# Patient Record
Sex: Female | Born: 1945 | Race: White | Hispanic: No | Smoking: Former smoker
Health system: Southern US, Community
[De-identification: ages and names within clinical notes are randomized; demographics above are authoritative.]

## PROBLEM LIST (undated history)

## (undated) DIAGNOSIS — C801 Malignant (primary) neoplasm, unspecified: Secondary | ICD-10-CM

## (undated) DIAGNOSIS — G4730 Sleep apnea, unspecified: Secondary | ICD-10-CM

## (undated) DIAGNOSIS — E119 Type 2 diabetes mellitus without complications: Secondary | ICD-10-CM

## (undated) DIAGNOSIS — D1802 Hemangioma of intracranial structures: Secondary | ICD-10-CM

## (undated) DIAGNOSIS — J45909 Unspecified asthma, uncomplicated: Secondary | ICD-10-CM

## (undated) DIAGNOSIS — E785 Hyperlipidemia, unspecified: Secondary | ICD-10-CM

## (undated) DIAGNOSIS — M1990 Unspecified osteoarthritis, unspecified site: Secondary | ICD-10-CM

## (undated) DIAGNOSIS — B351 Tinea unguium: Secondary | ICD-10-CM

## (undated) DIAGNOSIS — M810 Age-related osteoporosis without current pathological fracture: Secondary | ICD-10-CM

## (undated) DIAGNOSIS — E039 Hypothyroidism, unspecified: Secondary | ICD-10-CM

## (undated) HISTORY — PX: OTHER SURGICAL HISTORY: SHX169

## (undated) HISTORY — PX: OOPHORECTOMY: SHX86

## (undated) HISTORY — PX: TOTAL THYROIDECTOMY: SHX2547

## (undated) HISTORY — PX: TONSILLECTOMY: SUR1361

---

## 2010-12-30 DIAGNOSIS — H9193 Unspecified hearing loss, bilateral: Secondary | ICD-10-CM | POA: Insufficient documentation

## 2012-04-08 DIAGNOSIS — E669 Obesity, unspecified: Secondary | ICD-10-CM | POA: Insufficient documentation

## 2012-04-08 DIAGNOSIS — G4762 Sleep related leg cramps: Secondary | ICD-10-CM | POA: Insufficient documentation

## 2013-06-16 IMAGING — CR DG KNEE COMPLETE 4+V*R*
1 series · 4 of 4 positions shown · non-contrast
Comparison: None.

CLINICAL DATA: Fall.  Acute RIGHT knee pain.  Initial encounter.

EXAM:
RIGHT KNEE - COMPLETE 4+ VIEW

[Series 1: ap · 0.17mm/px · 4 of 4 slices shown]
[im 1/4]
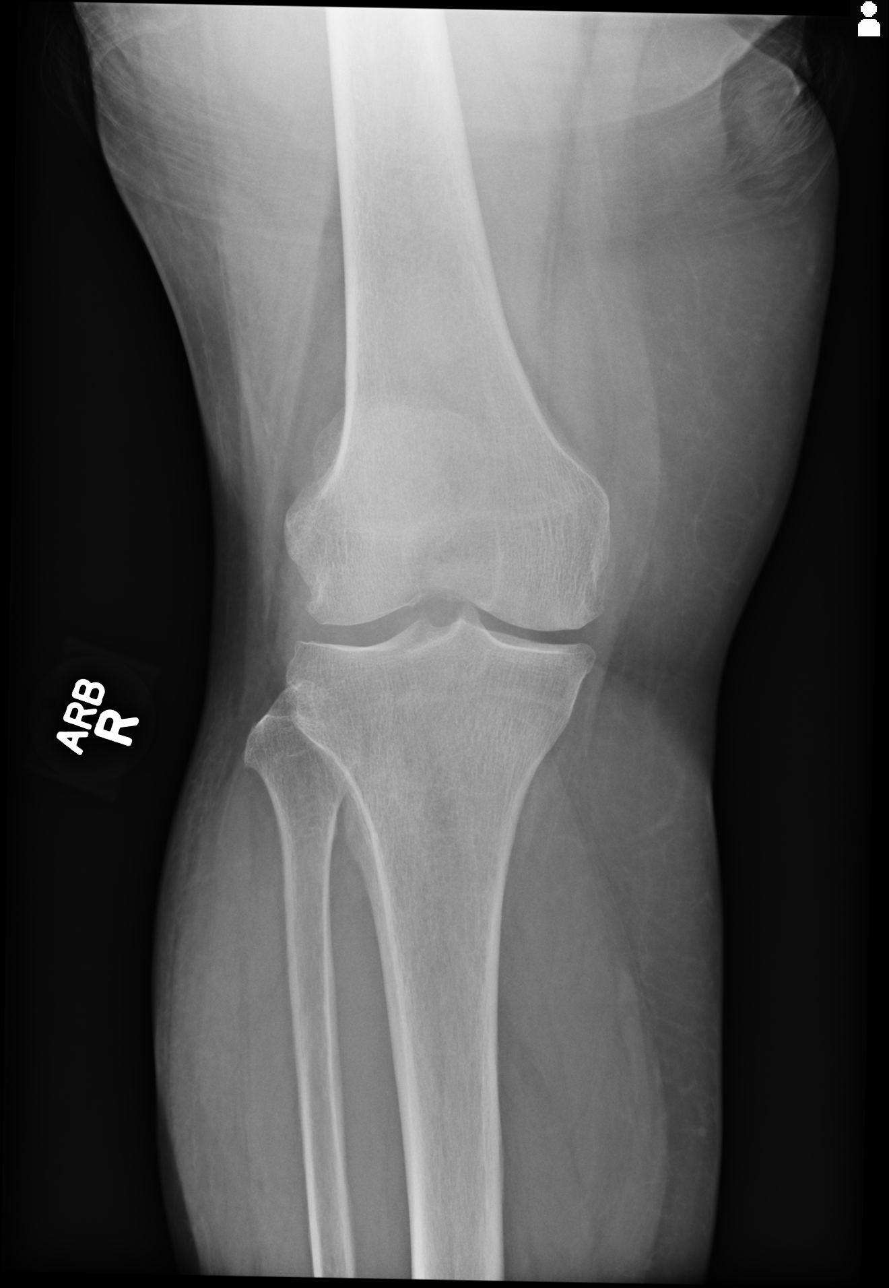
[im 2/4]
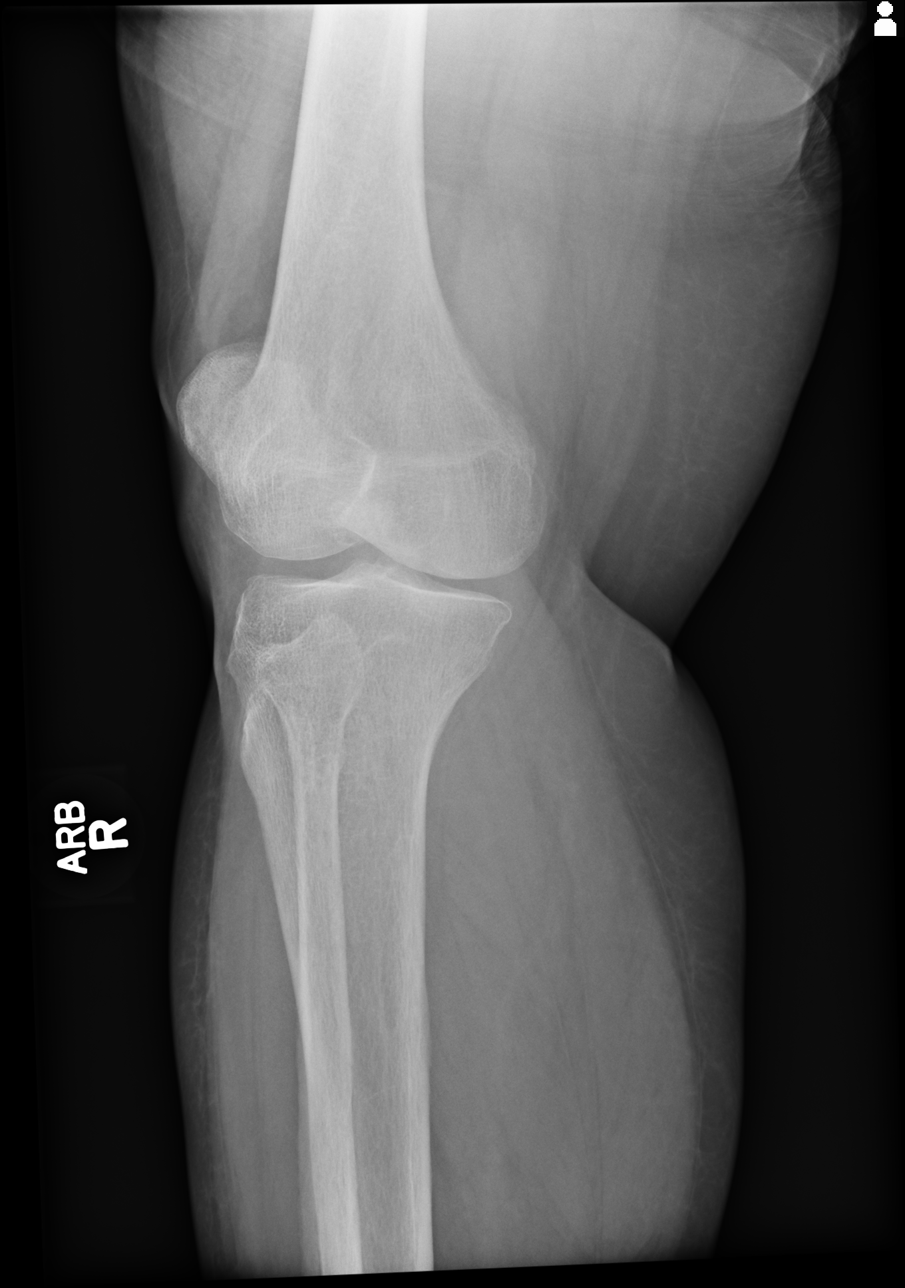
[im 3/4]
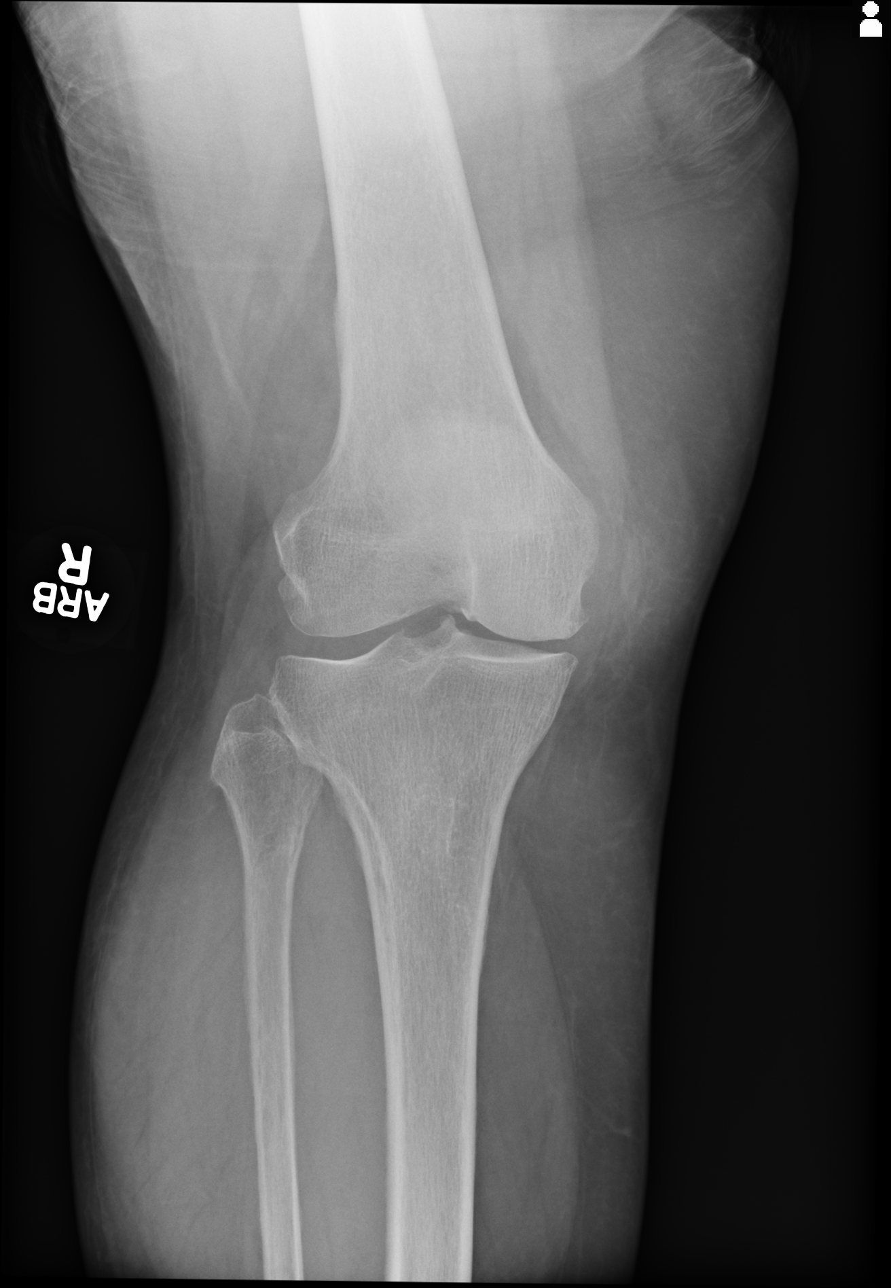
[im 4/4]
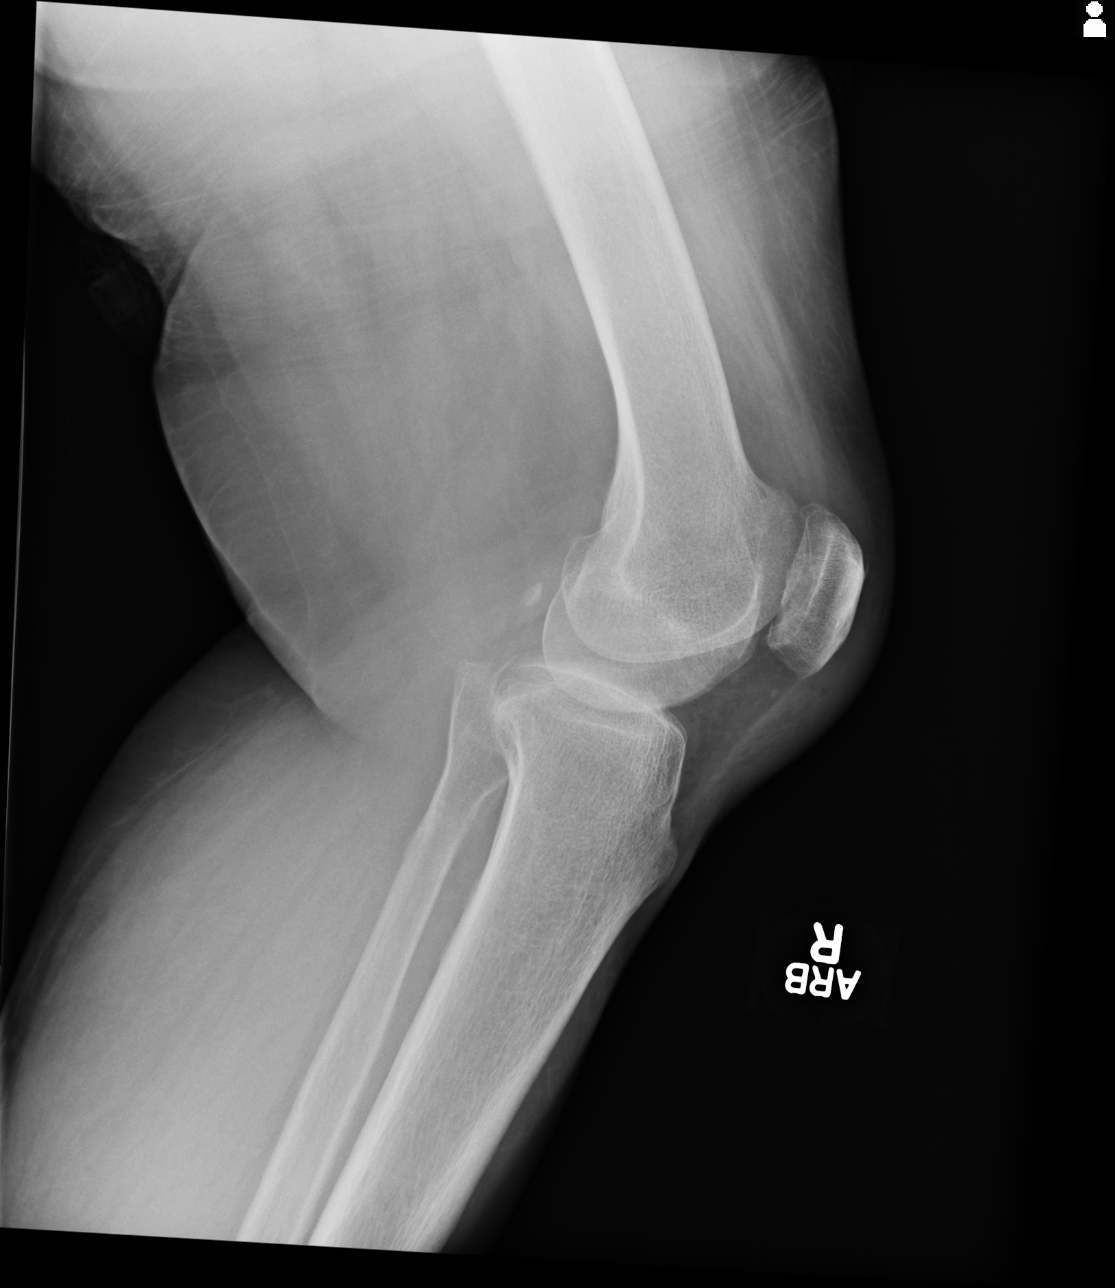

[4 of 4 positions shown; findings below may reference images not displayed]

FINDINGS: Mild medial compartment osteoarthritis. No fracture. Anatomic
alignment. No destructive osseous lesions. Negative for effusion.
Moderate patellofemoral osteoarthritis.
IMPRESSION: No acute osseous injury.

## 2013-06-16 IMAGING — CR DG FOOT COMPLETE 3+V*L*
1 series · 3 of 3 positions shown · non-contrast
Comparison: None.

CLINICAL DATA: Fall.  Foot pain.

EXAM:
LEFT FOOT - COMPLETE 3+ VIEW

[Series 1: ap · 0.17mm/px · 3 of 3 slices shown]
[im 1/3]
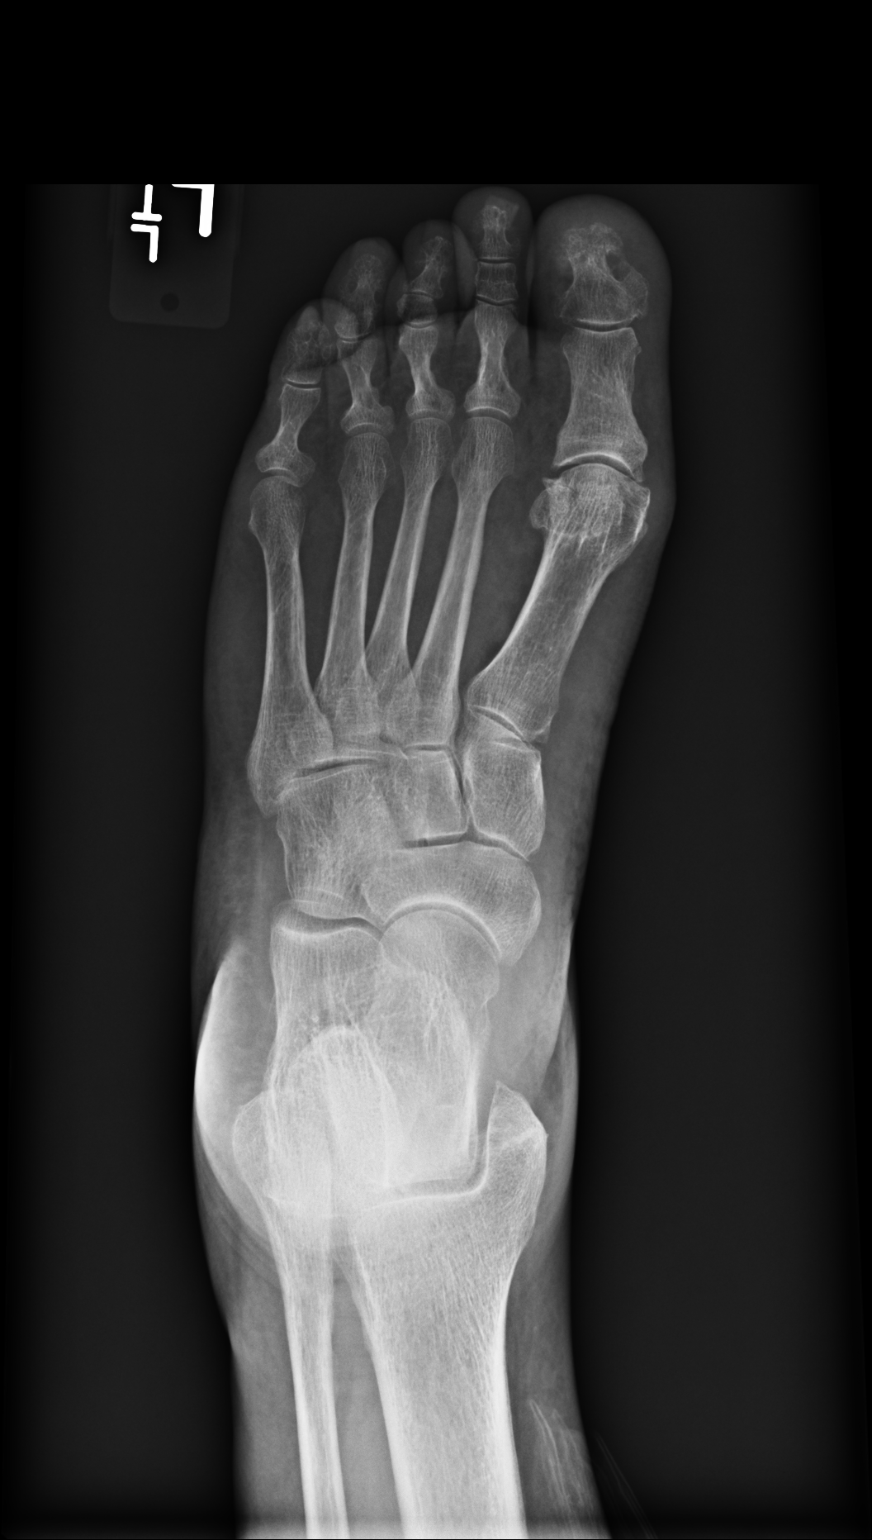
[im 2/3]
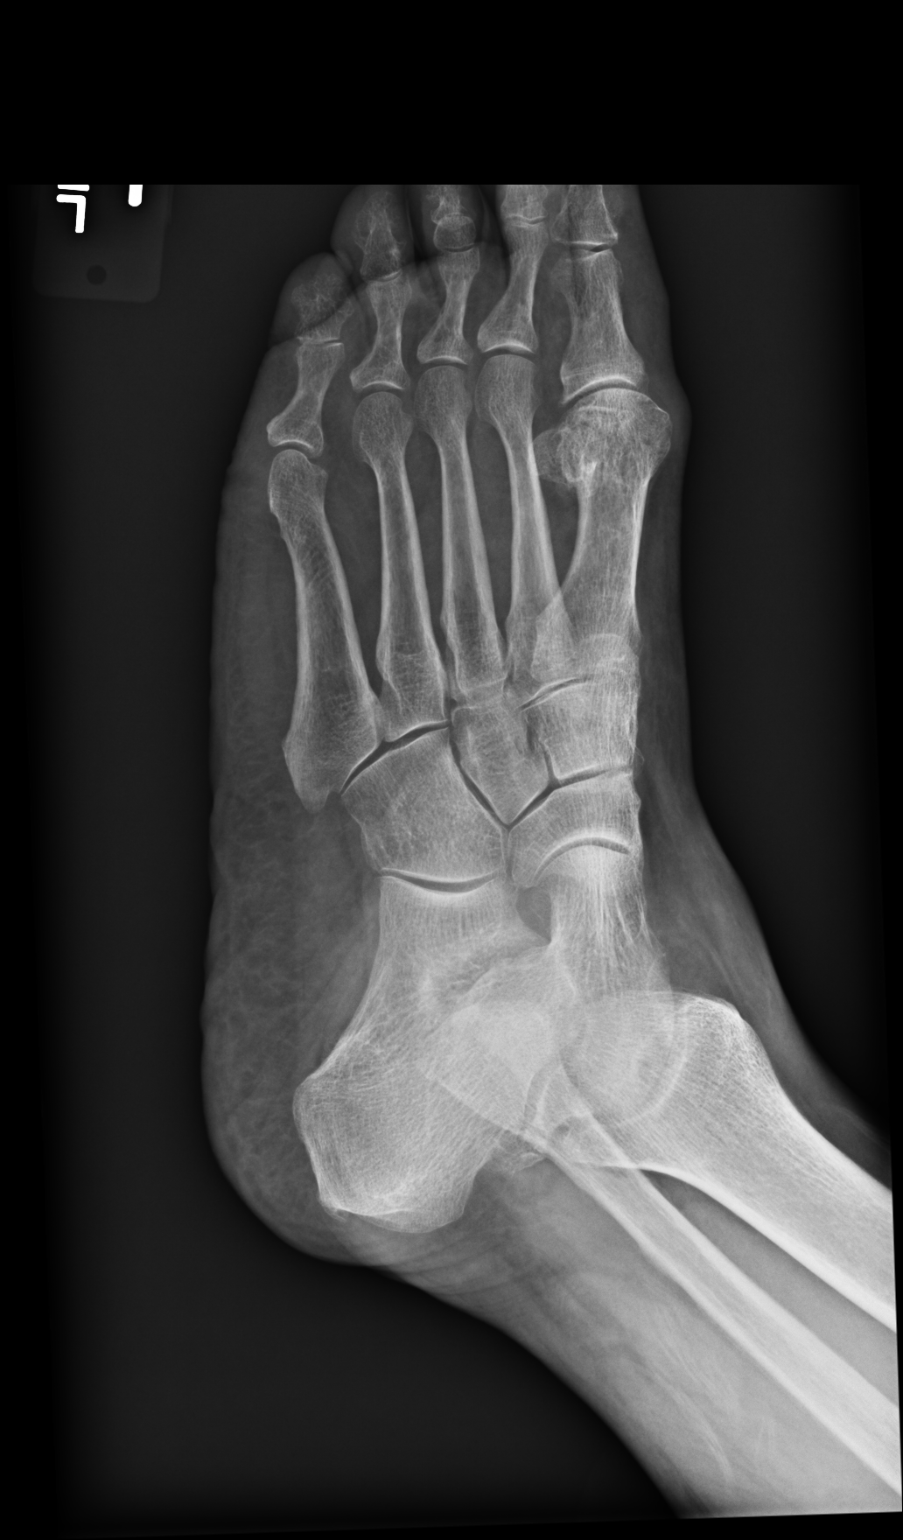
[im 3/3]
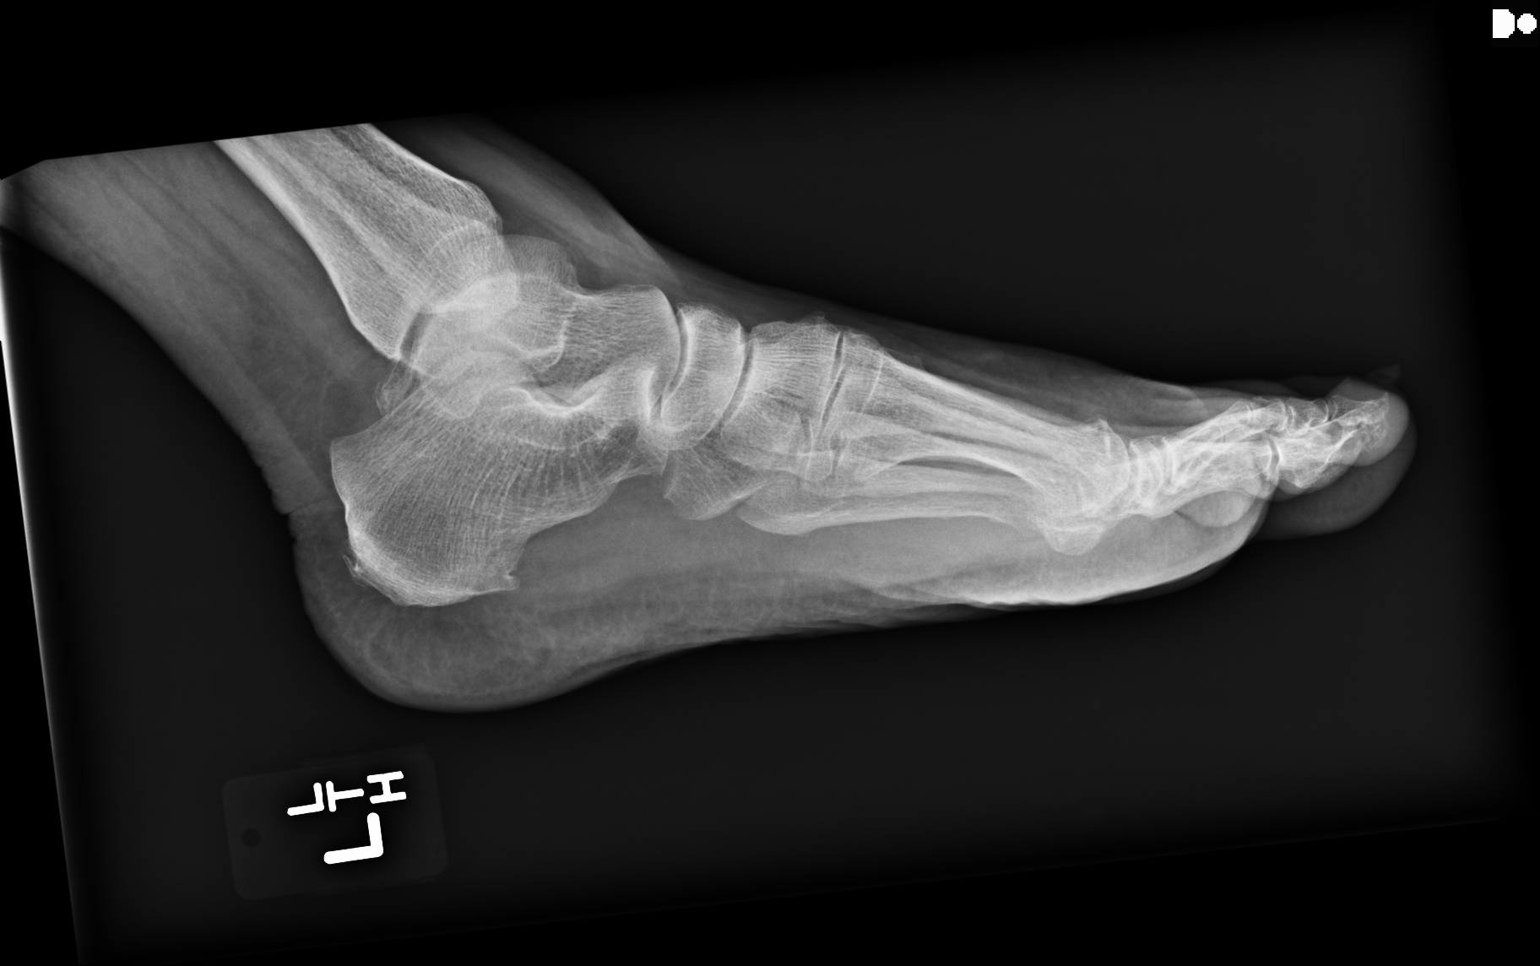

[3 of 3 positions shown; findings below may reference images not displayed]

FINDINGS: Moderate first MTP joint osteoarthritis. No acute displaced fracture
is identified. Soft tissues appear within normal limits.
IMPRESSION: No acute osseous abnormality.

## 2013-07-28 DIAGNOSIS — C73 Malignant neoplasm of thyroid gland: Secondary | ICD-10-CM | POA: Insufficient documentation

## 2013-10-01 ENCOUNTER — Emergency Department: Payer: Self-pay | Admitting: Emergency Medicine

## 2013-10-01 ENCOUNTER — Ambulatory Visit: Payer: Self-pay | Admitting: Internal Medicine

## 2013-10-01 IMAGING — CT CT MAXILLOFACIAL WITHOUT CONTRAST
4 series · 16 of 47 positions shown, 18 images · non-contrast
Comparison: None.

CLINICAL DATA: Fall. LEFT-sided head and facial injuries. Injury to
the LEFT globe.

EXAM:
CT HEAD WITHOUT CONTRAST
CT MAXILLOFACIAL WITHOUT CONTRAST
TECHNIQUE: Multidetector CT imaging of the head and maxillofacial structures
were performed using the standard protocol without intravenous
contrast. Multiplanar CT image reconstructions of the maxillofacial
structures were also generated.

[Series 2: head wo · axial · 0.48mm/px · z∈[-113,-15]mm · 6 of 30 slices shown, 8 images]
[im 5/30  brain]
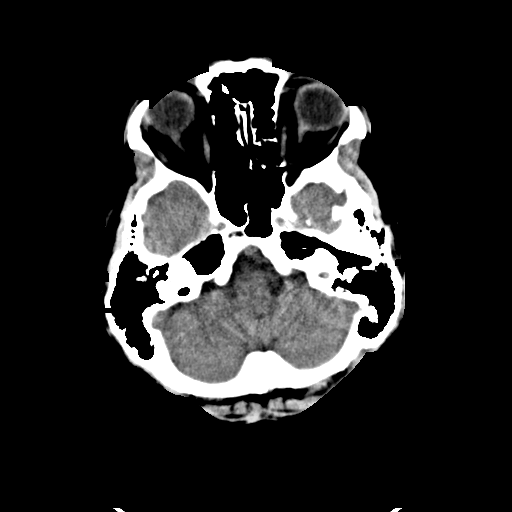
[im 5/30  bone]
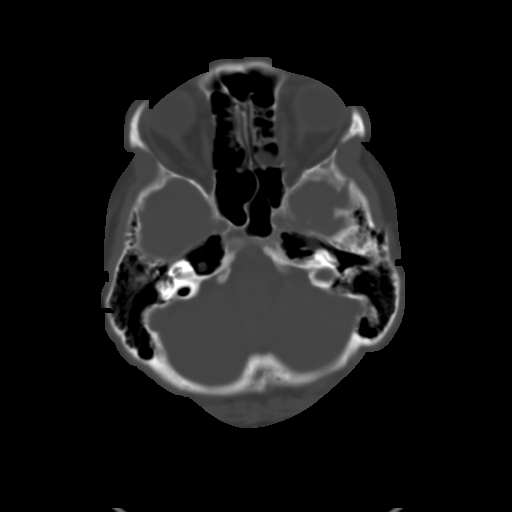
[im 9/30  bone]
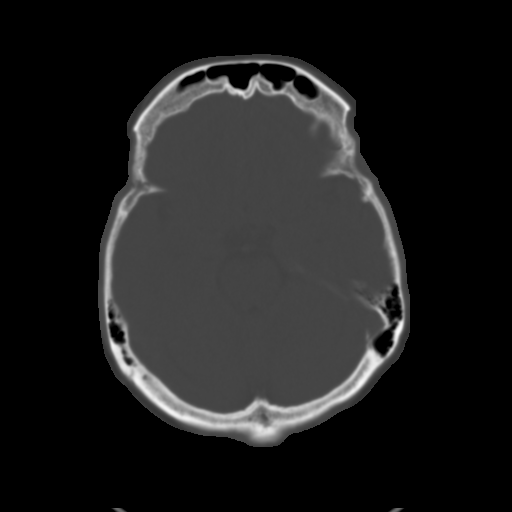
[im 13/30  bone]
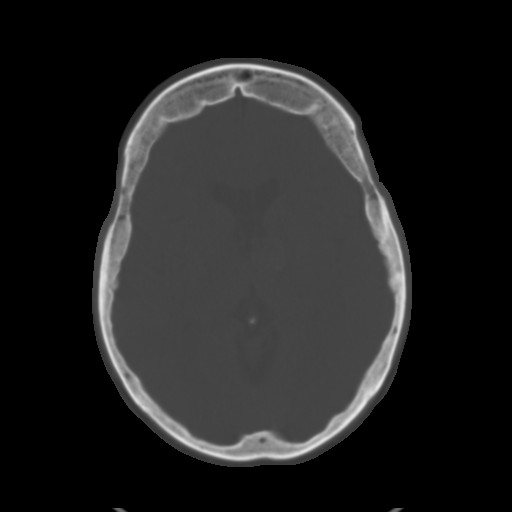
[im 17/30  bone]
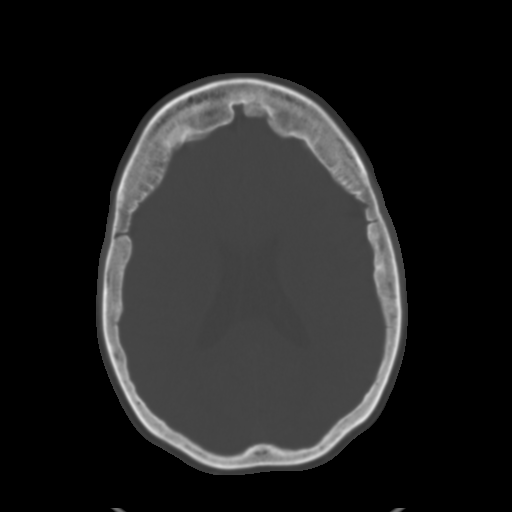
[im 21/30  brain]
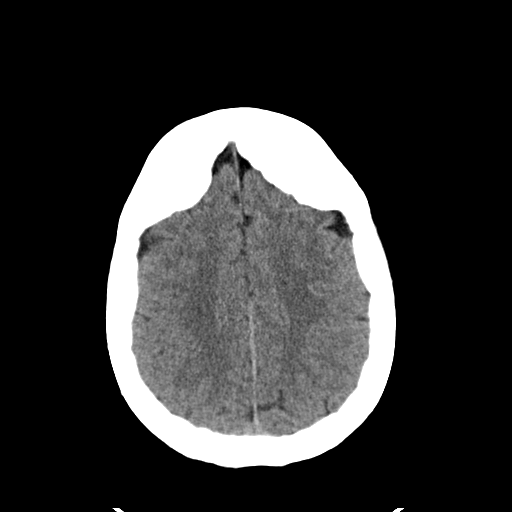
[im 21/30  bone]
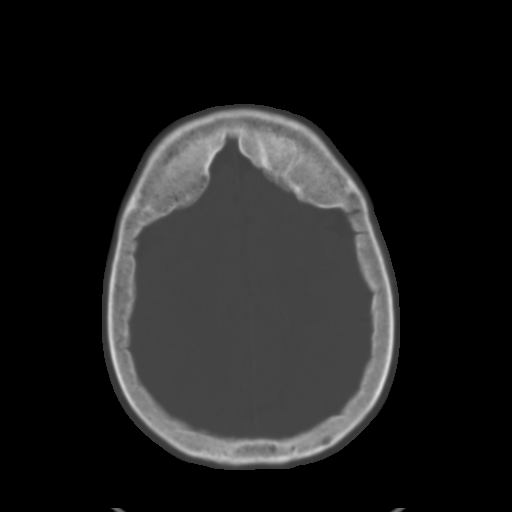
[im 25/30  bone]
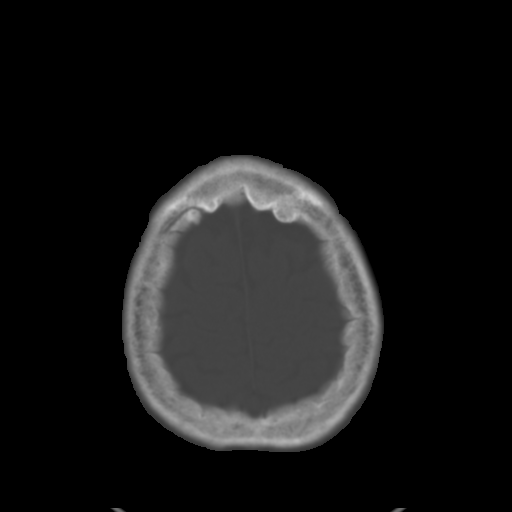

[Series 3: max soft · axial · 0.31mm/px · z∈[-239,-181]mm · 4 of 88 slices shown]
[im 9/88  brain]
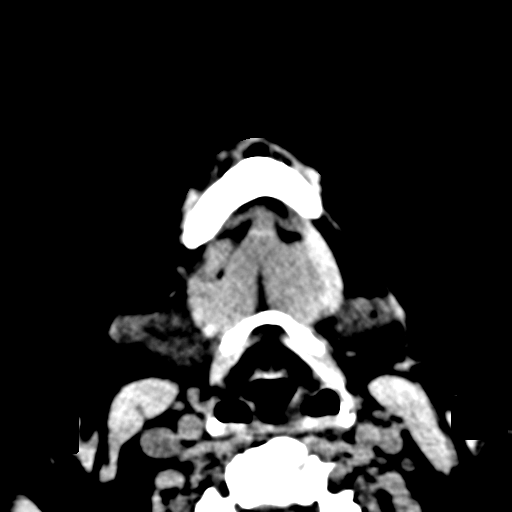
[im 17/88  brain]
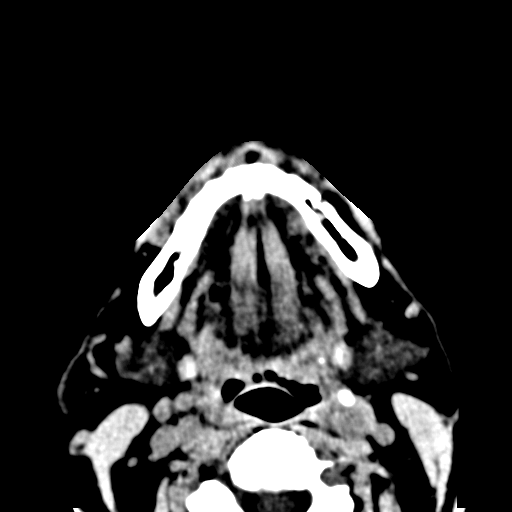
[im 30/88  brain]
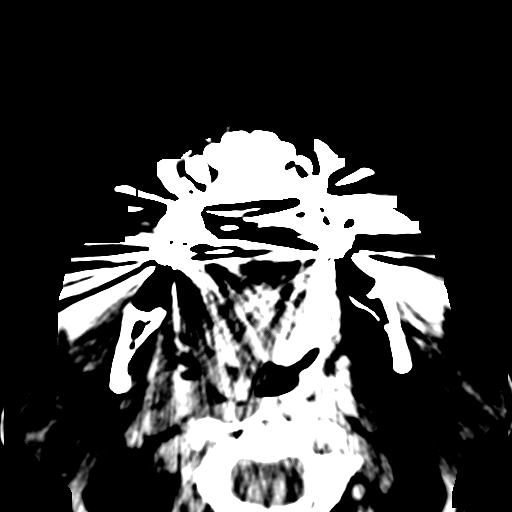
[im 38/88  brain]
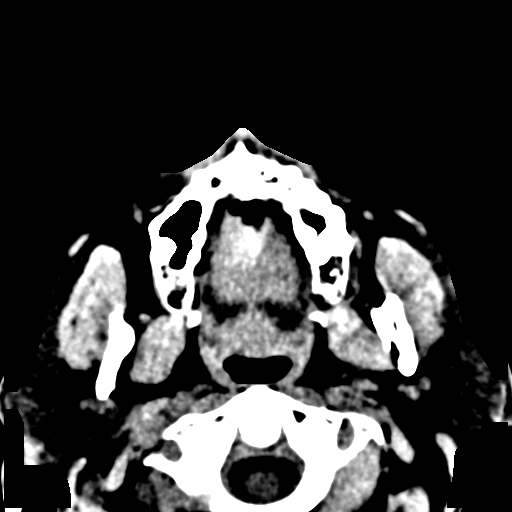

[Series 6: coronal soft · coronal · 0.35mm/px · 3 of 81 slices shown]
[im 27/81  bone]
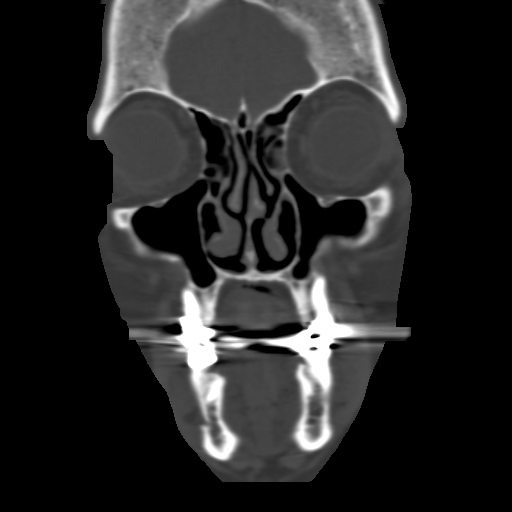
[im 36/81  bone]
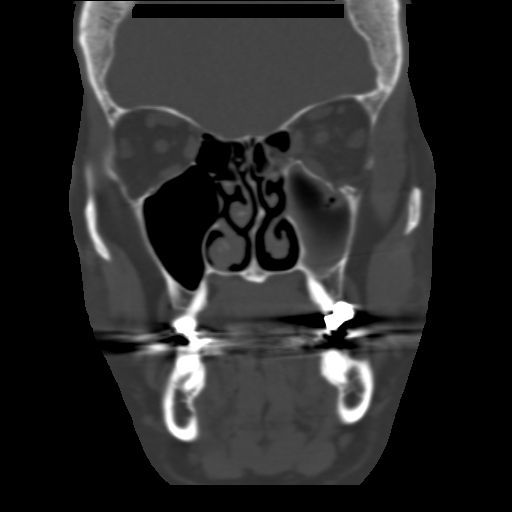
[im 45/81  bone]
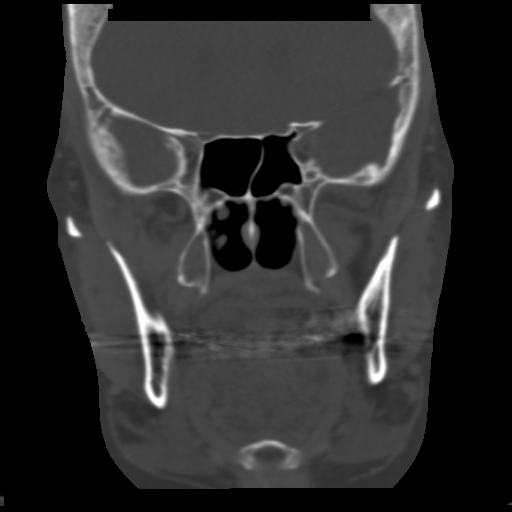

[Series 7: sagittal soft · sagittal · 0.36mm/px · 3 of 81 slices shown]
[im 27/81  bone]
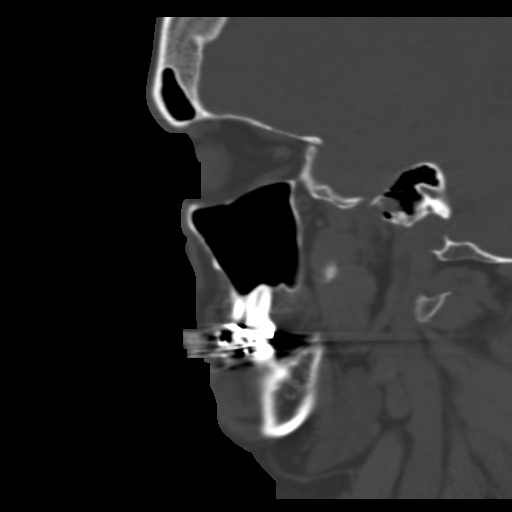
[im 41/81  bone]
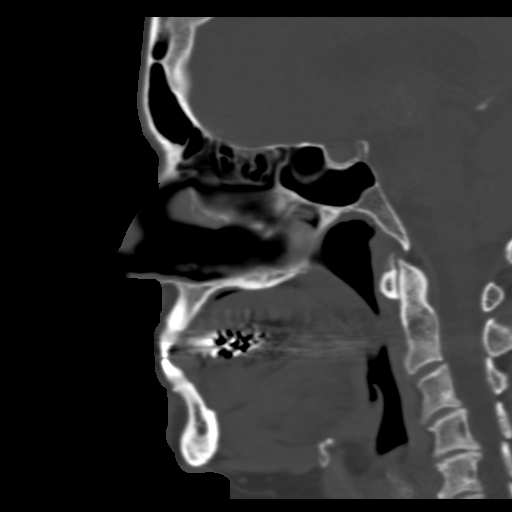
[im 54/81  bone]
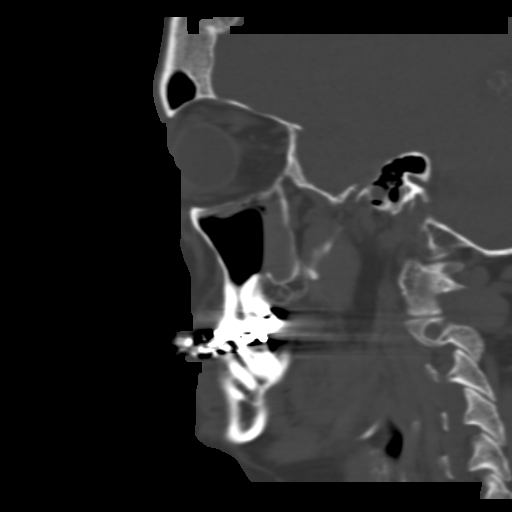

[16 of 47 positions shown; findings below may reference images not displayed]

FINDINGS: CT HEAD FINDINGS

There is a partially calcified mass in the LEFT basal ganglia,
extending into the anterior LEFT thalamus. This measures 28 mm x 20
mm. Cavum septum pellucidum is present. There is no midline shift or
hydrocephalus. The calvarium is intact. No skull fracture or
intracranial hemorrhage.

CT MAXILLOFACIAL FINDINGS

Globes: Intact

Bony orbits: LEFT orbital blowout fracture is present. Mild
depression the LEFT orbital floor. There is also LEFT medial orbital
wall blowout fracture with opacification of the LEFT ethmoid air
cells. Fractures nondisplaced. LEFT maxillary hemo sinus is present.
No herniation of extra-ocular muscles through the orbital floor
fracture. The lateral orbital rim appears intact. There is also a
fracture of the superior orbital rim, with a mildly displaced
fragment into the intraorbital fat superior to the superior rectus
muscle (image 59 series 9).

Pterygoid plates: Intact

Mandibular condyles:  Located.

Mandible: Intact.

Teeth:  Artifact from dental hardware.

Intracranial contents:  Grossly normal

Paranasal sinuses: LEFT maxillary hemo sinus and LEFT ethmoid air
cell hemo sinus. Otherwise clear.

Mastoid air cells: Clear.

Nasal bones: Intact.

Soft tissues: LEFT periorbital contusion. No intraconal or
intraorbital hematoma.

Visible cervical spine: Spondylosis.
IMPRESSION: 1. No acute intracranial abnormality. Partially calcified 28 mm x 20
mm LEFT basal ganglia mass consistent with neoplasm. The appearance
is suspicious for glial tumor such as oligodendroglioma. Lymphoma or
meningioma considered less likely however MRI should be helpful to
differentiate. Followup MRI is recommended with and without contrast
for further assessment.
2. LEFT orbital floor and medial orbital wall blowout fractures. No
CT evidence of entrapment. Mildly displaced superior orbital rim
fracture.

## 2013-11-06 ENCOUNTER — Ambulatory Visit: Payer: Self-pay | Admitting: Ophthalmology

## 2013-11-06 IMAGING — CT CT ORBITS WITHOUT CONTRAST
3 series · 15 of 47 positions shown, 18 images · non-contrast
Comparison: CT head and maxillofacial [DATE].

CLINICAL DATA: Left orbital fracture. The patient fell into a door
approximately 1 month ago. Diplopia when looking to the left.
Subsequent encounter.

EXAM:
CT ORBITS WITHOUT CONTRAST
TECHNIQUE: Multidetector CT imaging of the orbits was performed following the
standard protocol without intravenous contrast.

[Series 3: orbits soft---- · axial · 0.31mm/px · z∈[+150,+256]mm · 9 of 63 slices shown, 12 images]
[im 5/63  brain]
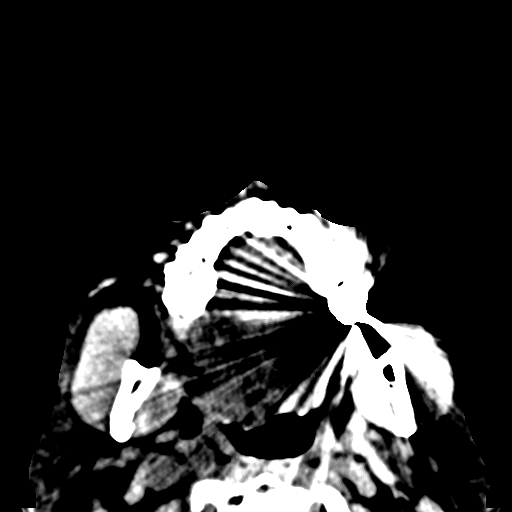
[im 5/63  bone]
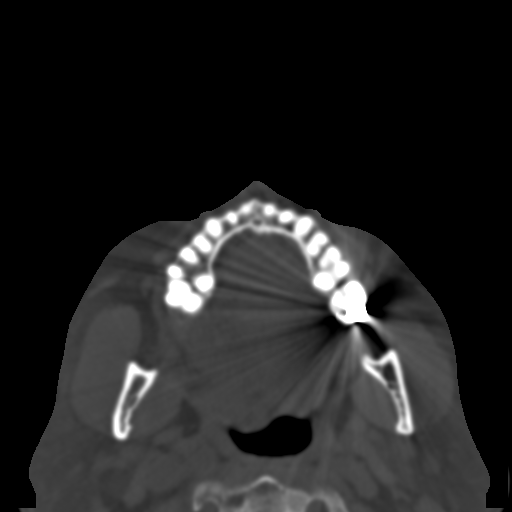
[im 11/63  bone]
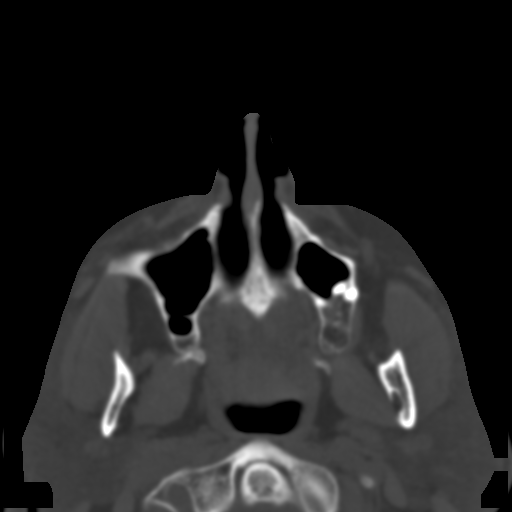
[im 18/63  bone]
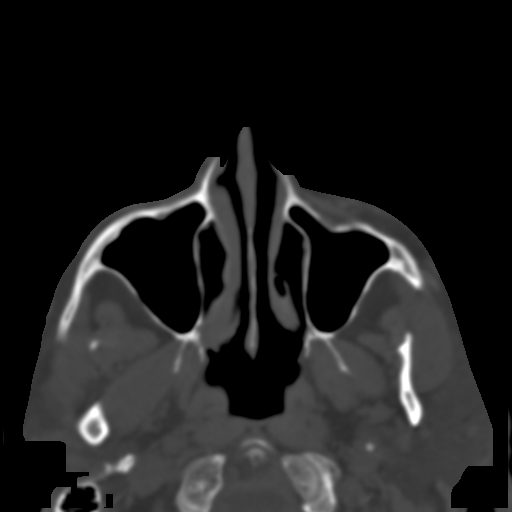
[im 24/63  bone]
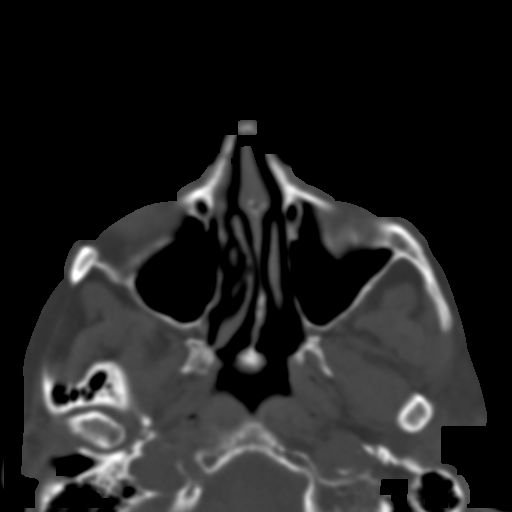
[im 33/63  brain]
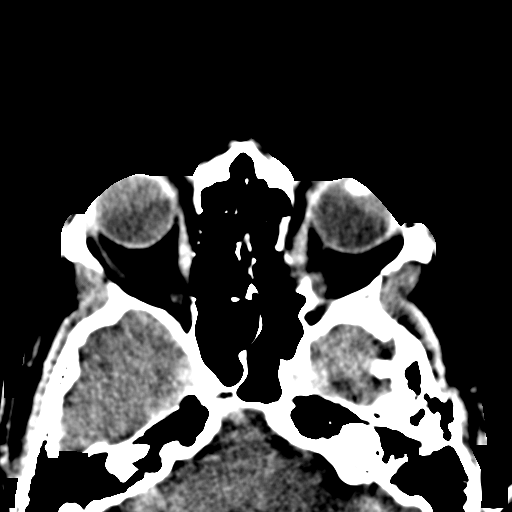
[im 33/63  bone]
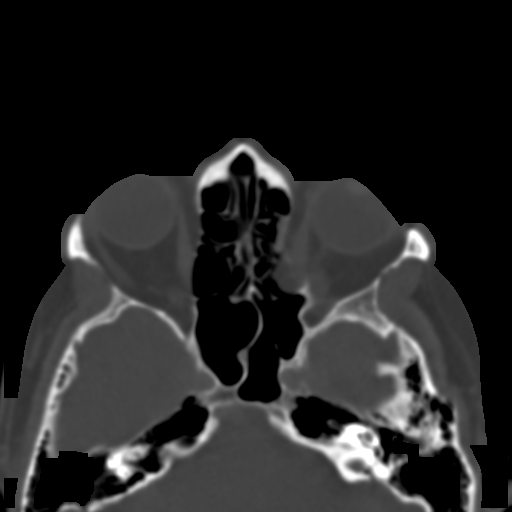
[im 39/63  bone]
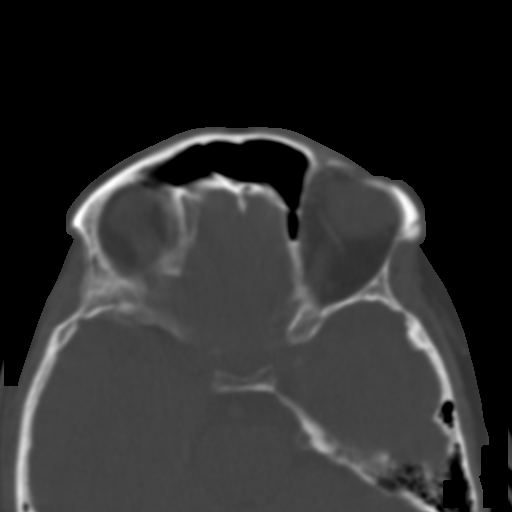
[im 45/63  bone]
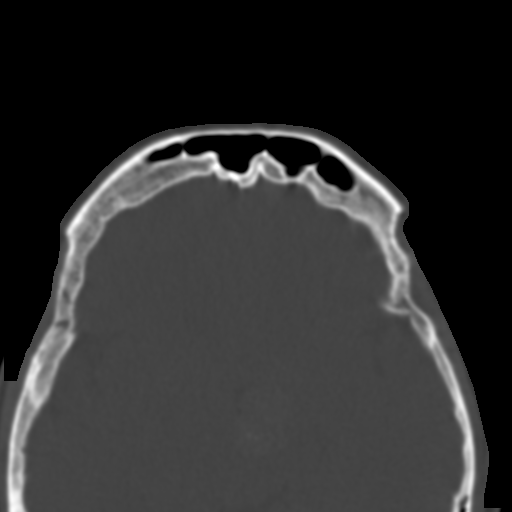
[im 52/63  bone]
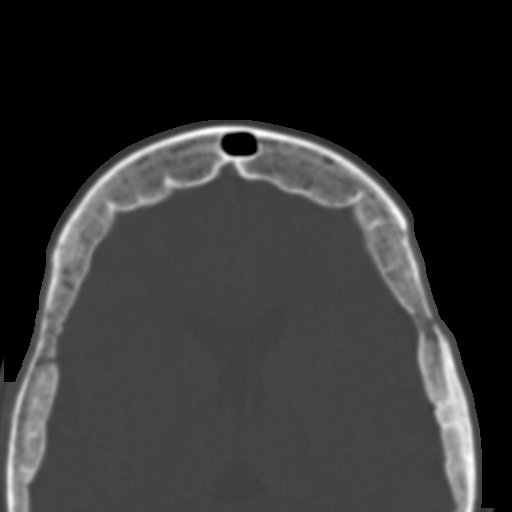
[im 58/63  brain]
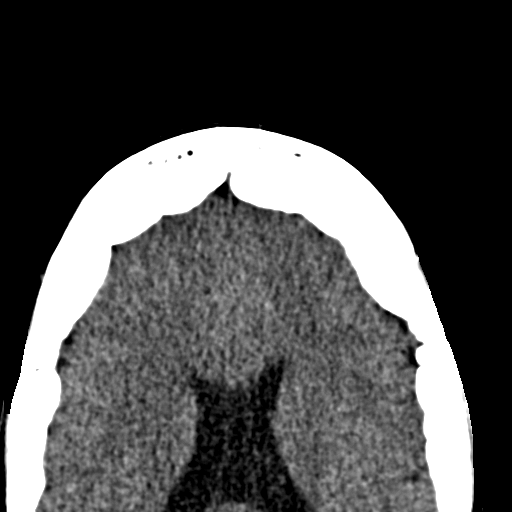
[im 58/63  bone]
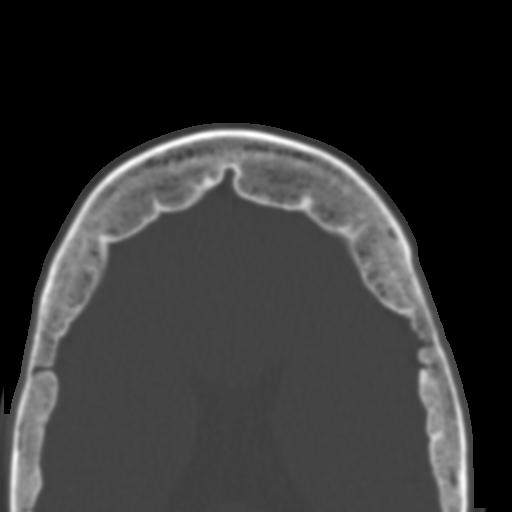

[Series 5: coronal soft · coronal · 0.26mm/px · 3 of 58 slices shown]
[im 20/58  bone]
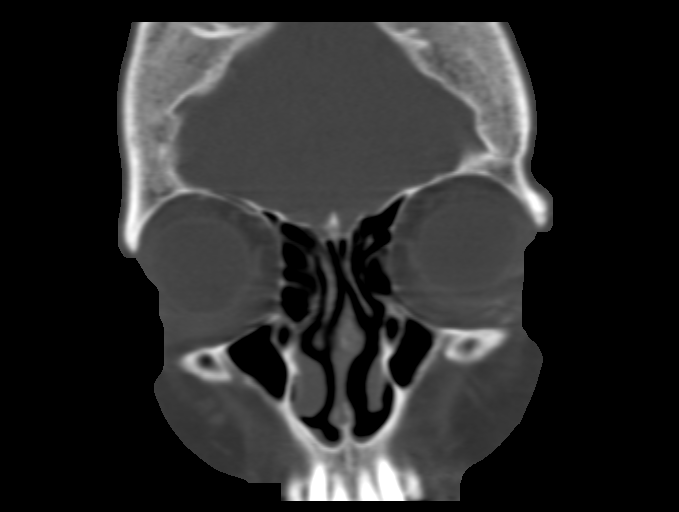
[im 26/58  bone]
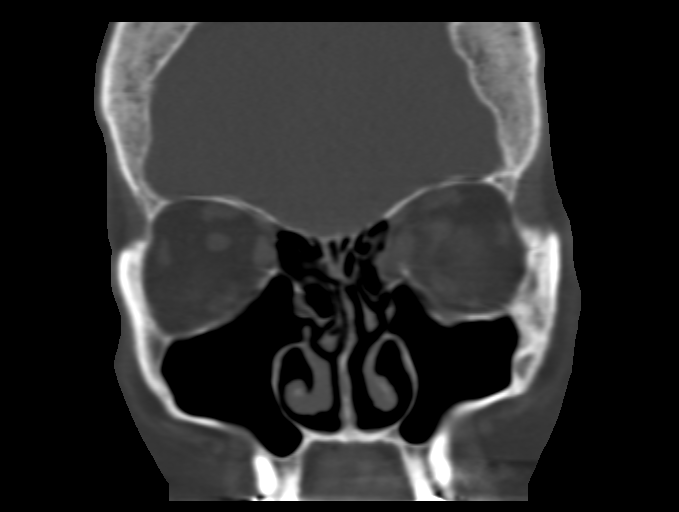
[im 32/58  bone]
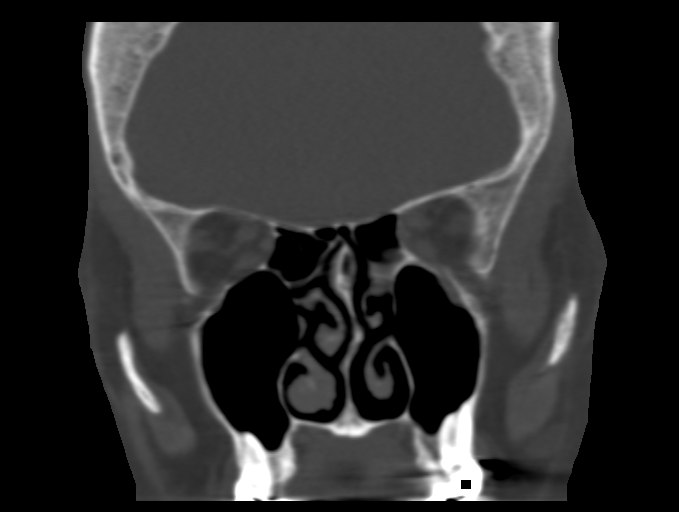

[Series 6: sagittal soft · sagittal · 0.27mm/px · 3 of 83 slices shown]
[im 28/83  bone]
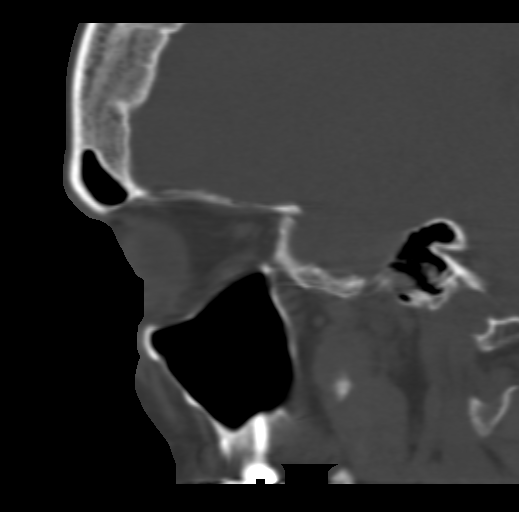
[im 42/83  bone]
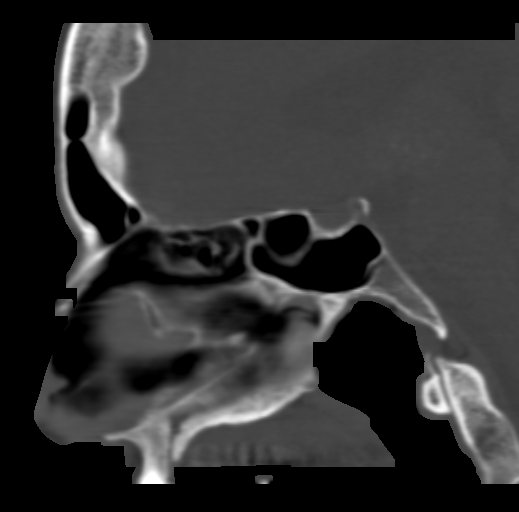
[im 55/83  bone]
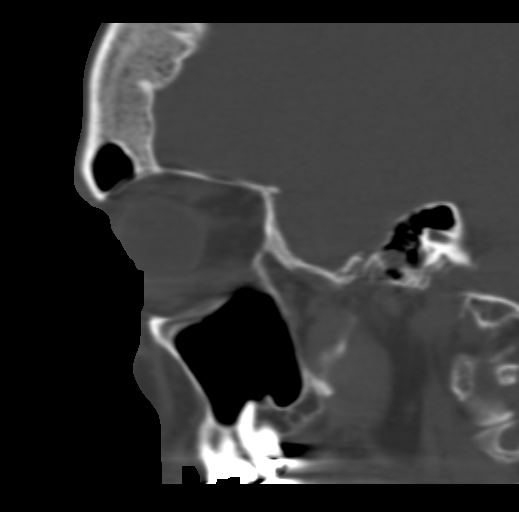

[15 of 47 positions shown; findings below may reference images not displayed]

FINDINGS: A calcified left basal ganglia and hypothalamic mass is stable to
slightly decreased in size compared to the prior exam. It measures
2.5 x 2.2 x 1.7 cm on today's exam. There is some local mass effect
on the ventricle without significant surrounding edema.

The left orbital floor fracture and medial ethmoid blowout fracture
are again noted. There is fat herniating into the left ethmoid air
cells posteriorly. The left medial rectus muscle is immediately
adjacent to this fracture and may be fixed by scar tissue and
adhesions posteriorly. The inferior rectus muscle is separate from
the orbital floor fracture.

The left maxillary fluid has resolved. No new fractures are present.
The globes are intact.
IMPRESSION: 1. Medial deviation of the posterior aspect of the left medial
rectus muscle may represent adhesion associated with scar tissue in
the medial orbital blowout fracture.
2. Minimally displaced left orbital floor fracture without
involvement of the left inferior rectus muscle.
3. Interval resolution of fluid in the left maxillary sinus.
4. No new fractures.
5. 2.5 cm calcified mass lesion within the left basal ganglia and
hypothalamus is stable to slightly decreased in size since the prior
exam. This remains concerning neoplasm, specifically
oligodendroglioma or lymphoma.

## 2013-11-30 DIAGNOSIS — E890 Postprocedural hypothyroidism: Secondary | ICD-10-CM | POA: Insufficient documentation

## 2014-06-25 ENCOUNTER — Encounter
Admission: RE | Disposition: A | Discharge status: Home / Self Care | Payer: Self-pay | Source: Ambulatory Visit | Attending: Unknown Physician Specialty

## 2014-06-25 ENCOUNTER — Encounter: Payer: Self-pay | Admitting: Anesthesiology

## 2014-06-25 ENCOUNTER — Ambulatory Visit
Admission: RE | Admit: 2014-06-25 | Discharge: 2014-06-25 | Disposition: A | Discharge status: Home / Self Care | Payer: Medicare Other | Source: Ambulatory Visit | Attending: Unknown Physician Specialty | Admitting: Unknown Physician Specialty

## 2014-06-25 ENCOUNTER — Ambulatory Visit: Payer: Medicare Other | Admitting: Anesthesiology

## 2014-06-25 DIAGNOSIS — Z791 Long term (current) use of non-steroidal anti-inflammatories (NSAID): Secondary | ICD-10-CM | POA: Diagnosis not present

## 2014-06-25 DIAGNOSIS — F419 Anxiety disorder, unspecified: Secondary | ICD-10-CM | POA: Insufficient documentation

## 2014-06-25 DIAGNOSIS — N189 Chronic kidney disease, unspecified: Secondary | ICD-10-CM | POA: Diagnosis not present

## 2014-06-25 DIAGNOSIS — E669 Obesity, unspecified: Secondary | ICD-10-CM | POA: Diagnosis not present

## 2014-06-25 DIAGNOSIS — J45909 Unspecified asthma, uncomplicated: Secondary | ICD-10-CM | POA: Diagnosis not present

## 2014-06-25 DIAGNOSIS — K6389 Other specified diseases of intestine: Secondary | ICD-10-CM | POA: Diagnosis not present

## 2014-06-25 DIAGNOSIS — Z6838 Body mass index (BMI) 38.0-38.9, adult: Secondary | ICD-10-CM | POA: Insufficient documentation

## 2014-06-25 DIAGNOSIS — K2970 Gastritis, unspecified, without bleeding: Secondary | ICD-10-CM | POA: Diagnosis not present

## 2014-06-25 DIAGNOSIS — Z85850 Personal history of malignant neoplasm of thyroid: Secondary | ICD-10-CM | POA: Insufficient documentation

## 2014-06-25 DIAGNOSIS — E063 Autoimmune thyroiditis: Secondary | ICD-10-CM | POA: Diagnosis not present

## 2014-06-25 DIAGNOSIS — Z79899 Other long term (current) drug therapy: Secondary | ICD-10-CM | POA: Diagnosis not present

## 2014-06-25 DIAGNOSIS — K222 Esophageal obstruction: Secondary | ICD-10-CM | POA: Diagnosis not present

## 2014-06-25 DIAGNOSIS — K269 Duodenal ulcer, unspecified as acute or chronic, without hemorrhage or perforation: Secondary | ICD-10-CM | POA: Insufficient documentation

## 2014-06-25 DIAGNOSIS — G4730 Sleep apnea, unspecified: Secondary | ICD-10-CM | POA: Insufficient documentation

## 2014-06-25 DIAGNOSIS — Z79810 Long term (current) use of selective estrogen receptor modulators (SERMs): Secondary | ICD-10-CM | POA: Diagnosis not present

## 2014-06-25 DIAGNOSIS — I129 Hypertensive chronic kidney disease with stage 1 through stage 4 chronic kidney disease, or unspecified chronic kidney disease: Secondary | ICD-10-CM | POA: Diagnosis not present

## 2014-06-25 DIAGNOSIS — R1310 Dysphagia, unspecified: Secondary | ICD-10-CM | POA: Diagnosis present

## 2014-06-25 HISTORY — PX: ESOPHAGOGASTRODUODENOSCOPY: SHX5428

## 2014-06-25 HISTORY — PX: SAVORY DILATION: SHX5439

## 2014-06-25 SURGERY — EGD (ESOPHAGOGASTRODUODENOSCOPY)
Anesthesia: General

## 2014-06-25 MED ORDER — GLYCOPYRROLATE 0.2 MG/ML IJ SOLN
INTRAMUSCULAR | Status: DC | PRN
  Administered 2014-06-25: 0.1 mg via INTRAVENOUS

## 2014-06-25 MED ORDER — PROPOFOL INFUSION 10 MG/ML OPTIME
INTRAVENOUS | Status: DC | PRN
  Administered 2014-06-25: 100 ug/kg/min via INTRAVENOUS

## 2014-06-25 MED ORDER — SODIUM CHLORIDE 0.9 % IV SOLN
INTRAVENOUS | Status: DC
  Administered 2014-06-25: 14:00:00 via INTRAVENOUS

## 2014-06-25 MED ORDER — LIDOCAINE HCL (CARDIAC) 20 MG/ML IV SOLN
INTRAVENOUS | Status: DC | PRN
  Administered 2014-06-25: 60 mg via INTRAVENOUS

## 2014-06-25 MED ORDER — MIDAZOLAM HCL 2 MG/2ML IJ SOLN
INTRAMUSCULAR | Status: DC | PRN
  Administered 2014-06-25: 2 mg via INTRAVENOUS

## 2014-06-25 MED ORDER — PROPOFOL 10 MG/ML IV BOLUS
INTRAVENOUS | Status: DC | PRN
  Administered 2014-06-25: 40 mg via INTRAVENOUS

## 2014-06-25 NOTE — Op Note (Signed)
[facility] Patient Name: Sherry Paul Procedure Date: 06/25/2014 1:25 PM MRN: [id number] Account #: [id number] Date of Birth: 08-Jan-1946 Admit Type: Outpatient Age: 69 Room: [facility] ENDO ROOM 1 Gender: Female Note Status: Finalized Procedure:         Upper GI endoscopy Indications:       Dysphagia Providers:         Manya Silvas, MD Referring MD:      Rogue Jury, MD (Referring MD) Medicines:         Propofol per Anesthesia Complications:     No immediate complications. Procedure:         Pre-Anesthesia Assessment:                    - After reviewing the risks and benefits, the patient was                     deemed in satisfactory condition to undergo the procedure.                    After obtaining informed consent, the endoscope was passed                     under direct vision. Throughout the procedure, the                     patient's blood pressure, pulse, and oxygen saturations                     were monitored continuously. The Olympus GIF-160 endoscope                     (S#. [id number]) was introduced through the mouth, and                     advanced to the second part of duodenum. The upper GI                     endoscopy was accomplished without difficulty. The patient                     tolerated the procedure well. Findings:      A mild Schatzki ring (acquired) was found at the gastroesophageal       junction. A guidewire was placed and the scope was withdrawn. Dilation       was performed with a Savary dilator with mild resistance at 16 mm and 17       mm. GEJ 40cm.      Patchy mild inflammation characterized by erythema and granularity was       found in the gastric antrum. Biopsies were taken with a cold forceps for       histology. Biopsies were taken with a cold forceps for Helicobacter       pylori testing.      Multiple diffuse erosions without bleeding were found in the duodenal       bulb.      Diffuse  mildly erythematous mucosa without active bleeding and with no       stigmata of bleeding was found in the second part of the duodenum.       Biopsies were taken with a cold forceps for histology. Impression:        - Mild Schatzki ring. Dilated.                    -  Gastritis. Biopsied.                    - Duodenal erosions without bleeding.                    - Erythematous duodenopathy. Biopsied. Recommendation:    - Await pathology results.                    - soft food for 3 days, eat slowly, chew well, take small                     bites Manya Silvas, MD 06/25/2014 2:04:37 PM This report has been signed electronically. Number of Addenda: 0 Note Initiated On: 06/25/2014 1:25 PM      [facility]

## 2014-06-25 NOTE — Anesthesia Postprocedure Evaluation (Signed)
  Anesthesia Post-op Note  Patient: Sherry Paul  Procedure(s) Performed: Procedure(s): ESOPHAGOGASTRODUODENOSCOPY (EGD) (N/A) SAVORY DILATION (N/A)  Anesthesia type:General  Patient location: PACU  Post pain: Pain level controlled  Post assessment: Post-op Vital signs reviewed, Patient's Cardiovascular Status Stable, Respiratory Function Stable, Patent Airway and No signs of Nausea or vomiting  Post vital signs: Reviewed and stable  Last Vitals:  Filed Vitals:   06/25/14 1444  BP: 121/63  Pulse: 89  Temp:   Resp: 24    Level of consciousness: awake, alert  and patient cooperative  Complications: No apparent anesthesia complications

## 2014-06-25 NOTE — H&P (Signed)
   Primary Care Physician:  Mertie Clause, MD Primary Gastroenterologist:  Dr. Vira Agar  Pre-Procedure History & Physical: HPI:  Sherry Paul is a 69 y.o. female is here for an endoscopy.   Thyroid disease with nodules, asthma, sleep apnea, thyroid cancer  Thyroidectomy 2015  Prior to Admission medications   Medication Sig Start Date End Date Taking? Authorizing Provider  Calcium Carb-Cholecalciferol (CALCIUM 600 + D PO) Take 1 tablet by mouth 2 (two) times daily.   Yes Historical Provider, MD  Coenzyme Q10 200 MG capsule Take 1 mg by mouth 2 (two) times daily.   Yes Historical Provider, MD  Glucosamine Sulfate 1000 MG CAPS Take 1 capsule by mouth 2 (two) times daily.   Yes Historical Provider, MD  ibuprofen (ADVIL,MOTRIN) 200 MG tablet Take 200-400 mg by mouth every 6 (six) hours as needed for moderate pain.   Yes Historical Provider, MD  levothyroxine (SYNTHROID, LEVOTHROID) 150 MCG tablet Take 150-225 mcg by mouth daily. 1 tab 6 days a week, 225 mcg on Sunday   Yes Historical Provider, MD  lovastatin (MEVACOR) 40 MG tablet Take 40 mg by mouth at bedtime.   Yes Historical Provider, MD  Multiple Vitamin (MULTIVITAMIN WITH MINERALS) TABS tablet Take 1 tablet by mouth daily.   Yes Historical Provider, MD  Multiple Vitamins-Minerals (PRESERVISION AREDS 2 PO) Take 2 tablets by mouth daily.   Yes Historical Provider, MD  Phosphatidylserine 100 MG CAPS Take 1 tablet by mouth 2 (two) times daily.   Yes Historical Provider, MD  psyllium (REGULOID) 0.52 G capsule Take 5 capsules by mouth 2 (two) times daily.   Yes Historical Provider, MD  raloxifene (EVISTA) 60 MG tablet Take 60 mg by mouth daily.   Yes Historical Provider, MD  [facility] Wort 300 MG CAPS Take 1 capsule by mouth 2 (two) times daily.   Yes Historical Provider, MD    Codeine allergy  Kidney failure,HTN, Hashimoto disease  History   Social History  . Marital Status: Married    Spouse Name: N/A  . Number of  Children: N/A  . Years of Education: N/A   Occupational History  . Not on file.   Social History Main Topics  . Smoking status: Not on file  . Smokeless tobacco: Not on file  . Alcohol Use: Not on file  . Drug Use: Not on file  . Sexual Activity: Not on file   Other Topics Concern  . Not on file   Social History Narrative  . No narrative on file    Review of Systems: See HPI, otherwise negative ROS  Physical Exam: BP 110/57 mmHg  Pulse 93  Temp(Src) 97 F (36.1 C) (Oral)  Resp 18  Ht 5\' 1"  (1.549 m)  Wt 92.987 kg (205 lb)  BMI 38.75 kg/m2  SpO2 97% General:   Alert,  pleasant and cooperative in NAD Head:  Normocephalic and atraumatic. Neck:  Supple; no masses or thyromegaly. Lungs:  Clear throughout to auscultation.    Heart:  Regular rate and rhythm. Abdomen:  Soft, nontender and nondistended. Normal bowel sounds, without guarding, and without rebound.   Neurologic:  Alert and  oriented x4;  grossly normal neurologically.  Impression/Plan: Sherry Paul is here for an endoscopy to be performed for dysphagia   Risks, benefits, limitations, and alternatives regarding  endoscopy have been reviewed with the patient.  Questions have been answered.  All parties agreeable.   Gaylyn Cheers, MD  06/25/2014, 1:32 PM

## 2014-06-25 NOTE — Transfer of Care (Signed)
Immediate Anesthesia Transfer of Care Note  Patient: Sherry Paul  Procedure(s) Performed: Procedure(s): ESOPHAGOGASTRODUODENOSCOPY (EGD) (N/A) SAVORY DILATION (N/A)  Patient Location: Endoscopy Unit  Anesthesia Type:General  Level of Consciousness: awake, alert , oriented and patient cooperative  Airway & Oxygen Therapy: Patient Spontanous Breathing and Patient connected to nasal cannula oxygen  Post-op Assessment: Report given to RN, Post -op Vital signs reviewed and stable and Patient moving all extremities X 4  Post vital signs: Reviewed and stable  Last Vitals:  Filed Vitals:   06/25/14 1413  BP: 113/59  Pulse: 99  Temp: 36.2 C  Resp: 22    Complications: No apparent anesthesia complications

## 2014-06-25 NOTE — Anesthesia Preprocedure Evaluation (Addendum)
Anesthesia Evaluation  Patient identified by MRN, date of birth, ID band Patient awake    Reviewed: Allergy & Precautions, NPO status , Patient's Chart, lab work & pertinent test results  Airway Mallampati: II  TM Distance: >3 FB     Dental  (+) Chipped   Pulmonary          Cardiovascular     Neuro/Psych Anxiety    GI/Hepatic   Endo/Other    Renal/GU      Musculoskeletal   Abdominal   Peds  Hematology   Anesthesia Other Findings Obesity.  Reproductive/Obstetrics                            Anesthesia Physical Anesthesia Plan  ASA: III  Anesthesia Plan: General   Post-op Pain Management:    Induction: Intravenous  Airway Management Planned: Nasal Cannula  Additional Equipment:   Intra-op Plan:   Post-operative Plan:   Informed Consent: I have reviewed the patients History and Physical, chart, labs and discussed the procedure including the risks, benefits and alternatives for the proposed anesthesia with the patient or authorized representative who has indicated his/her understanding and acceptance.     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 )       Anesthesia Quick Evaluation

## 2014-06-27 LAB — SURGICAL PATHOLOGY

## 2014-06-28 ENCOUNTER — Encounter: Payer: Self-pay | Admitting: Unknown Physician Specialty

## 2015-04-17 ENCOUNTER — Other Ambulatory Visit: Payer: Self-pay | Admitting: Nurse Practitioner

## 2015-04-17 DIAGNOSIS — Z1231 Encounter for screening mammogram for malignant neoplasm of breast: Secondary | ICD-10-CM

## 2015-04-25 ENCOUNTER — Ambulatory Visit
Admission: RE | Admit: 2015-04-25 | Discharge: 2015-04-25 | Disposition: A | Discharge status: Home / Self Care | Payer: Medicare Other | Source: Ambulatory Visit | Attending: Nurse Practitioner | Admitting: Nurse Practitioner

## 2015-04-25 DIAGNOSIS — Z1231 Encounter for screening mammogram for malignant neoplasm of breast: Secondary | ICD-10-CM | POA: Insufficient documentation

## 2015-04-25 HISTORY — DX: Malignant (primary) neoplasm, unspecified: C80.1

## 2015-04-25 IMAGING — MG MM DIGITAL SCREENING BILAT W/ CAD
1 series · 4 of 4 positions shown · non-contrast
Comparison: None.

CLINICAL DATA: Screening.

EXAM:
DIGITAL SCREENING BILATERAL MAMMOGRAM WITH CAD

[R CC · right · 4 of 4 slices shown]
[im 1/4]
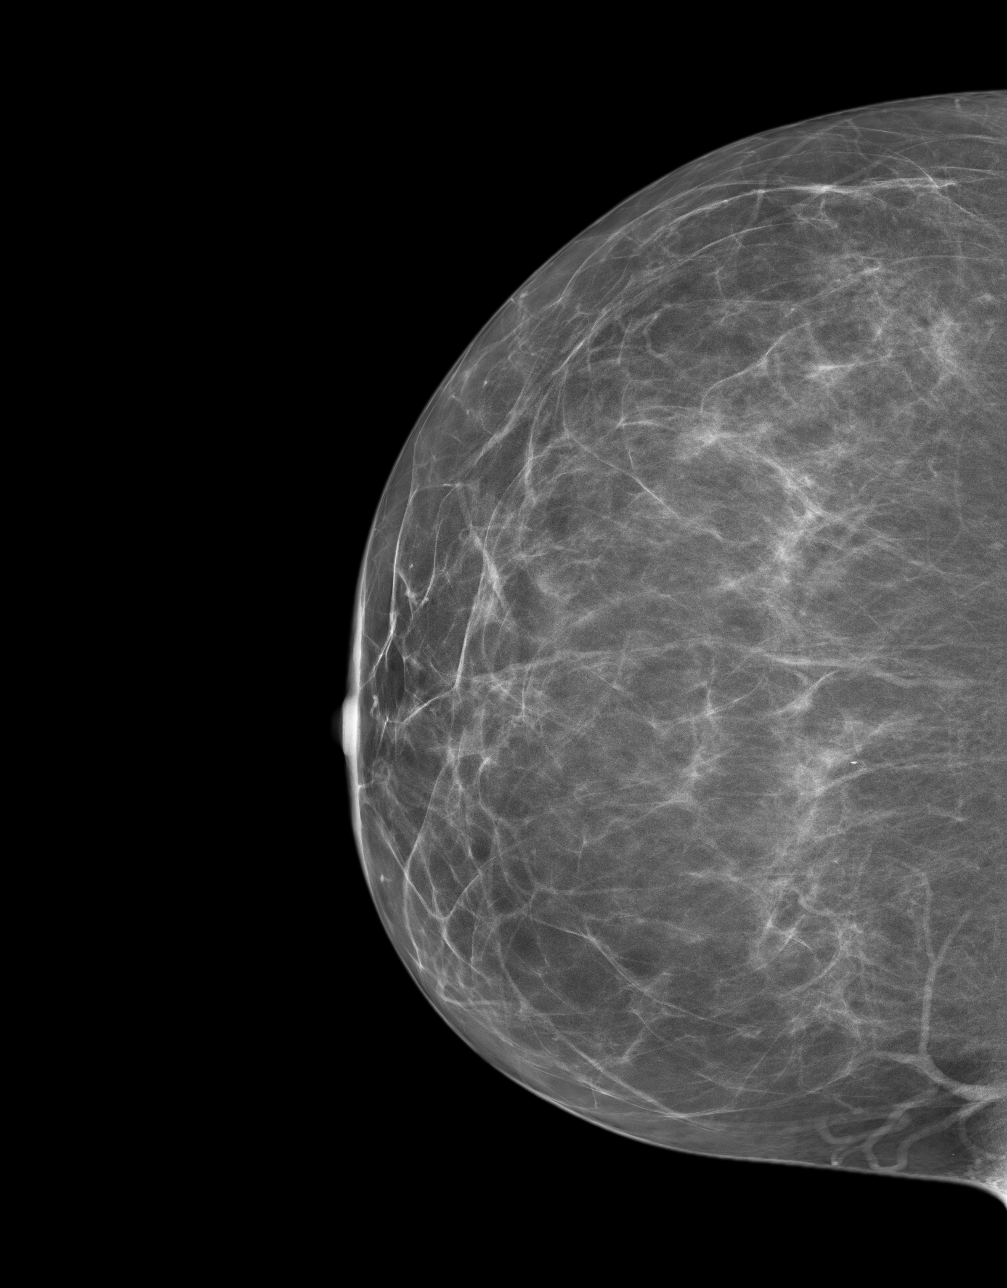
[im 2/4]
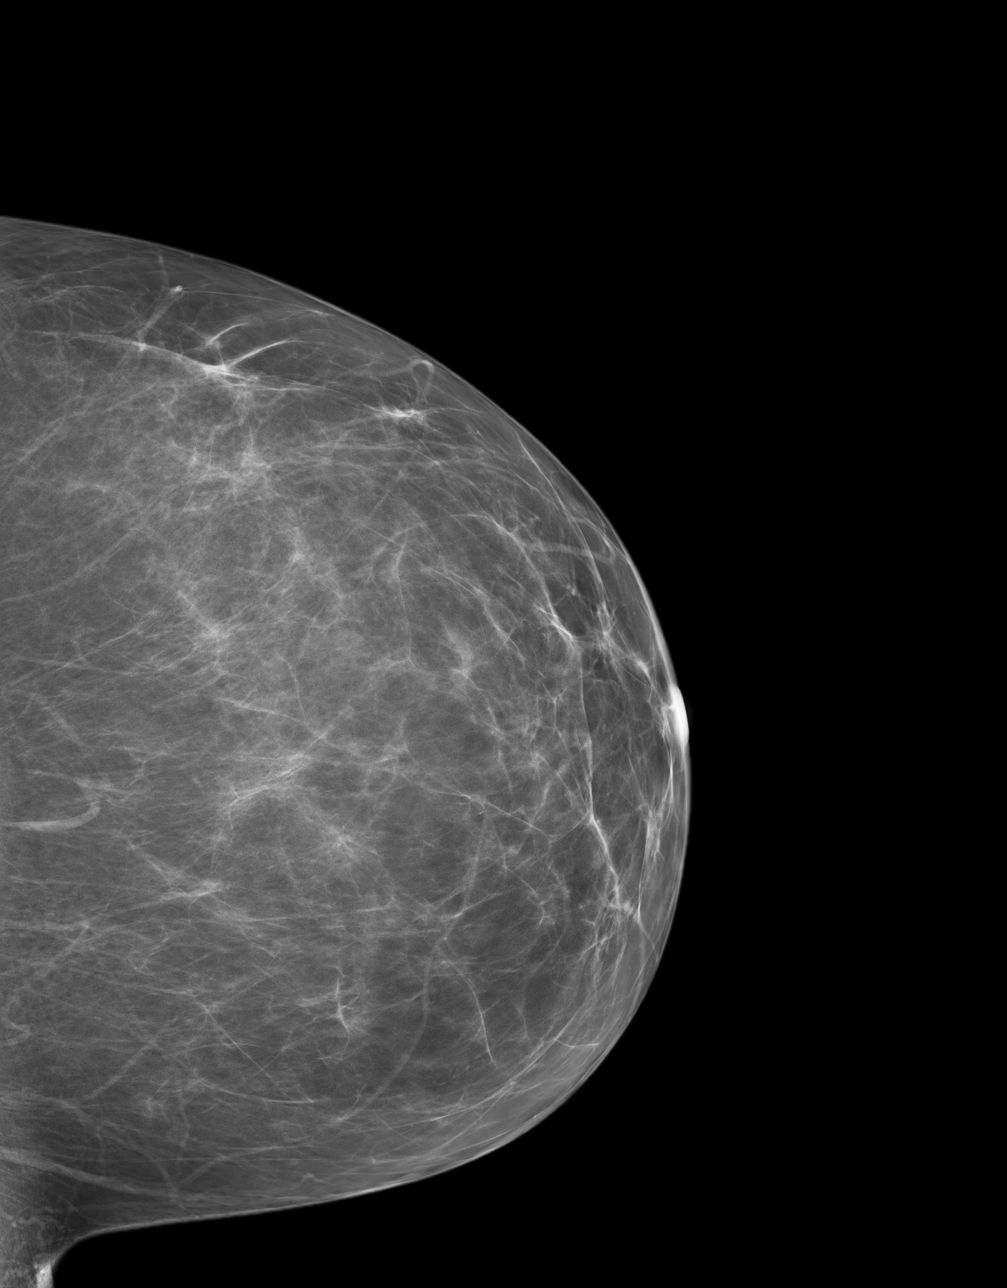
[im 3/4]
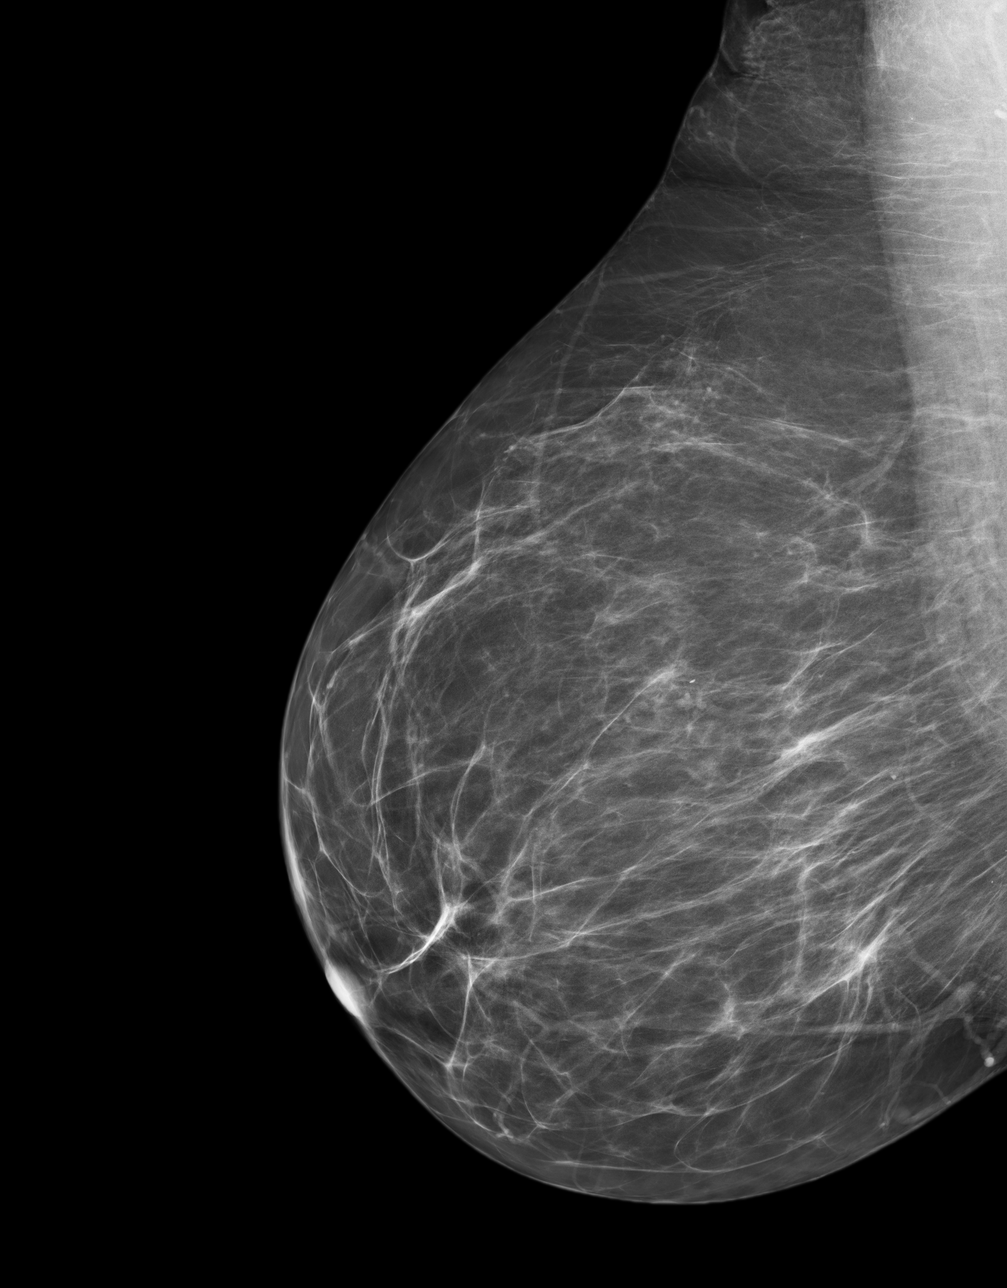
[im 4/4]
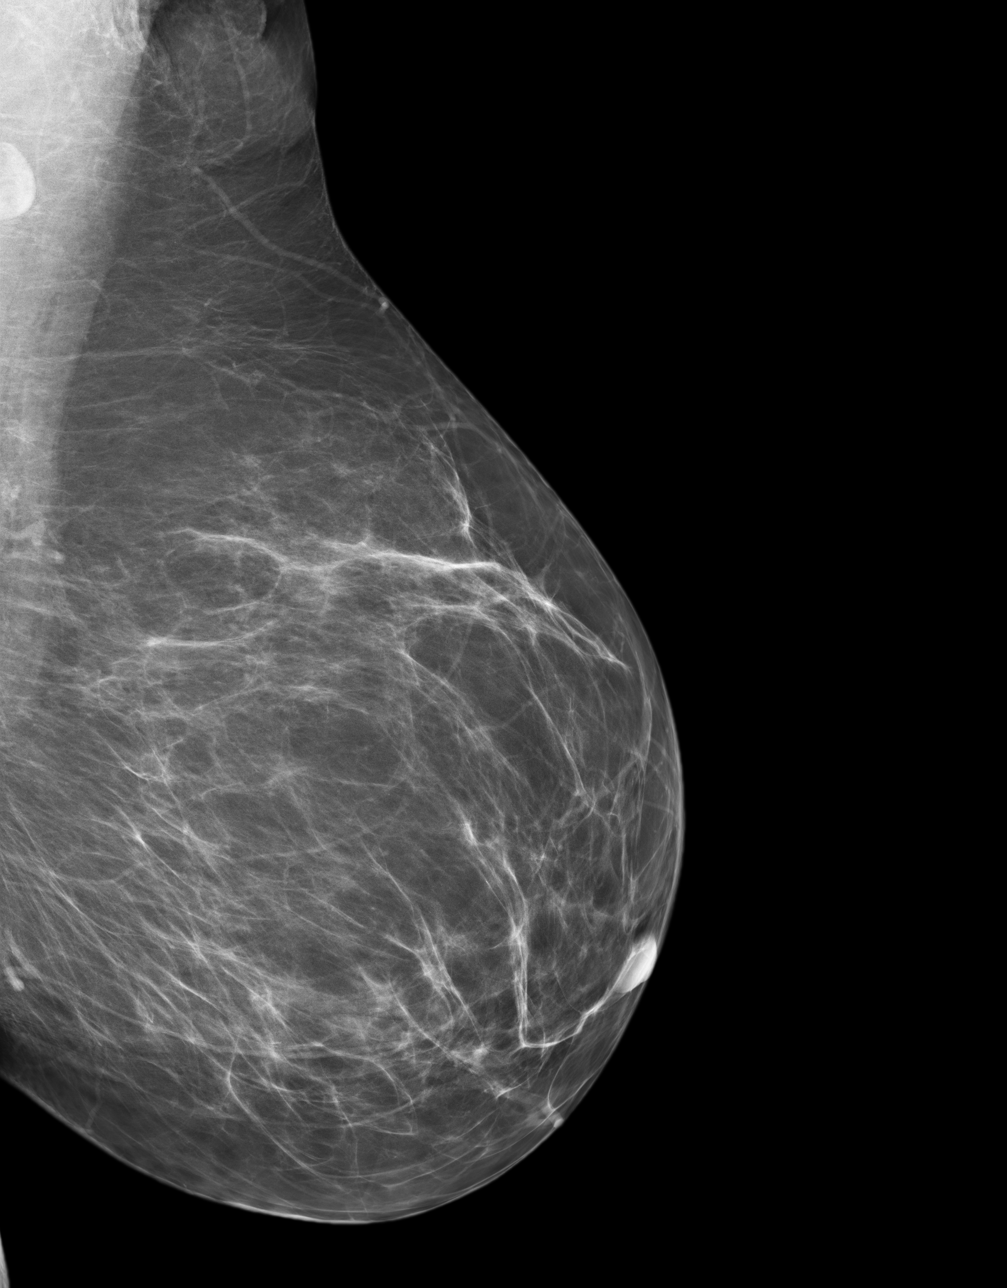

[4 of 4 positions shown; findings below may reference images not displayed]

ACR Breast Density Category b: There are scattered areas of
fibroglandular density.
FINDINGS: There are no findings suspicious for malignancy. Images were
processed with CAD.
IMPRESSION: No mammographic evidence of malignancy. A result letter of this
screening mammogram will be mailed directly to the patient.

RECOMMENDATION:
Screening mammogram in one year. (Code:[GD])

BI-RADS CATEGORY  1: Negative.

## 2016-01-20 DIAGNOSIS — M810 Age-related osteoporosis without current pathological fracture: Secondary | ICD-10-CM | POA: Insufficient documentation

## 2017-05-19 ENCOUNTER — Other Ambulatory Visit: Payer: Self-pay | Admitting: Nurse Practitioner

## 2017-05-19 DIAGNOSIS — Z1239 Encounter for other screening for malignant neoplasm of breast: Secondary | ICD-10-CM

## 2017-05-19 DIAGNOSIS — E119 Type 2 diabetes mellitus without complications: Secondary | ICD-10-CM | POA: Insufficient documentation

## 2017-09-06 ENCOUNTER — Ambulatory Visit
Admission: RE | Admit: 2017-09-06 | Discharge: 2017-09-06 | Disposition: A | Discharge status: Home / Self Care | Payer: Medicare Other | Source: Ambulatory Visit | Attending: Nurse Practitioner | Admitting: Nurse Practitioner

## 2017-09-06 DIAGNOSIS — Z1231 Encounter for screening mammogram for malignant neoplasm of breast: Secondary | ICD-10-CM | POA: Insufficient documentation

## 2017-09-06 DIAGNOSIS — Z1239 Encounter for other screening for malignant neoplasm of breast: Secondary | ICD-10-CM

## 2017-10-01 ENCOUNTER — Encounter: Payer: Self-pay | Admitting: *Deleted

## 2017-10-04 ENCOUNTER — Ambulatory Visit
Admission: RE | Admit: 2017-10-04 | Discharge: 2017-10-04 | Disposition: A | Discharge status: Home / Self Care | Payer: Medicare Other | Source: Ambulatory Visit | Attending: Unknown Physician Specialty | Admitting: Unknown Physician Specialty

## 2017-10-04 ENCOUNTER — Ambulatory Visit: Payer: Medicare Other | Admitting: Anesthesiology

## 2017-10-04 ENCOUNTER — Encounter
Admission: RE | Disposition: A | Discharge status: Home / Self Care | Payer: Self-pay | Source: Ambulatory Visit | Attending: Unknown Physician Specialty

## 2017-10-04 ENCOUNTER — Encounter: Payer: Self-pay | Admitting: Anesthesiology

## 2017-10-04 DIAGNOSIS — Z79899 Other long term (current) drug therapy: Secondary | ICD-10-CM | POA: Insufficient documentation

## 2017-10-04 DIAGNOSIS — K621 Rectal polyp: Secondary | ICD-10-CM | POA: Diagnosis not present

## 2017-10-04 DIAGNOSIS — Z86010 Personal history of colonic polyps: Secondary | ICD-10-CM | POA: Diagnosis not present

## 2017-10-04 DIAGNOSIS — D320 Benign neoplasm of cerebral meninges: Secondary | ICD-10-CM | POA: Insufficient documentation

## 2017-10-04 DIAGNOSIS — G4730 Sleep apnea, unspecified: Secondary | ICD-10-CM | POA: Insufficient documentation

## 2017-10-04 DIAGNOSIS — Z1211 Encounter for screening for malignant neoplasm of colon: Secondary | ICD-10-CM | POA: Insufficient documentation

## 2017-10-04 DIAGNOSIS — M810 Age-related osteoporosis without current pathological fracture: Secondary | ICD-10-CM | POA: Insufficient documentation

## 2017-10-04 DIAGNOSIS — E785 Hyperlipidemia, unspecified: Secondary | ICD-10-CM | POA: Insufficient documentation

## 2017-10-04 DIAGNOSIS — Z85850 Personal history of malignant neoplasm of thyroid: Secondary | ICD-10-CM | POA: Diagnosis not present

## 2017-10-04 DIAGNOSIS — K640 First degree hemorrhoids: Secondary | ICD-10-CM | POA: Insufficient documentation

## 2017-10-04 DIAGNOSIS — Z885 Allergy status to narcotic agent status: Secondary | ICD-10-CM | POA: Diagnosis not present

## 2017-10-04 DIAGNOSIS — Z87891 Personal history of nicotine dependence: Secondary | ICD-10-CM | POA: Diagnosis not present

## 2017-10-04 HISTORY — DX: Sleep apnea, unspecified: G47.30

## 2017-10-04 HISTORY — PX: COLONOSCOPY WITH PROPOFOL: SHX5780

## 2017-10-04 HISTORY — DX: Tinea unguium: B35.1

## 2017-10-04 HISTORY — DX: Hyperlipidemia, unspecified: E78.5

## 2017-10-04 HISTORY — DX: Type 2 diabetes mellitus without complications: E11.9

## 2017-10-04 HISTORY — DX: Age-related osteoporosis without current pathological fracture: M81.0

## 2017-10-04 SURGERY — COLONOSCOPY WITH PROPOFOL
Anesthesia: General

## 2017-10-04 MED ORDER — FENTANYL CITRATE (PF) 100 MCG/2ML IJ SOLN
INTRAMUSCULAR | Status: DC | PRN
  Administered 2017-10-04 (×2): 50 ug via INTRAVENOUS

## 2017-10-04 MED ORDER — MIDAZOLAM HCL 2 MG/2ML IJ SOLN
INTRAMUSCULAR | Status: AC
  Filled 2017-10-04: qty 2

## 2017-10-04 MED ORDER — PROPOFOL 500 MG/50ML IV EMUL
INTRAVENOUS | Status: DC | PRN
  Administered 2017-10-04: 50 ug/kg/min via INTRAVENOUS

## 2017-10-04 MED ORDER — SODIUM CHLORIDE 0.9 % IV SOLN
INTRAVENOUS | Status: DC
  Administered 2017-10-04: 10:00:00 via INTRAVENOUS

## 2017-10-04 MED ORDER — LIDOCAINE HCL (PF) 2 % IJ SOLN
INTRAMUSCULAR | Status: AC
  Filled 2017-10-04: qty 10

## 2017-10-04 MED ORDER — PROPOFOL 10 MG/ML IV BOLUS
INTRAVENOUS | Status: DC | PRN
  Administered 2017-10-04: 30 mg via INTRAVENOUS

## 2017-10-04 MED ORDER — MIDAZOLAM HCL 5 MG/5ML IJ SOLN
INTRAMUSCULAR | Status: DC | PRN
  Administered 2017-10-04: 2 mg via INTRAVENOUS

## 2017-10-04 MED ORDER — LIDOCAINE HCL (PF) 2 % IJ SOLN
INTRAMUSCULAR | Status: DC | PRN
  Administered 2017-10-04: 80 mg

## 2017-10-04 MED ORDER — FENTANYL CITRATE (PF) 100 MCG/2ML IJ SOLN
INTRAMUSCULAR | Status: AC
  Filled 2017-10-04: qty 2

## 2017-10-04 MED ORDER — EPHEDRINE SULFATE 50 MG/ML IJ SOLN
INTRAMUSCULAR | Status: DC | PRN
  Administered 2017-10-04: 10 mg via INTRAVENOUS

## 2017-10-04 NOTE — Anesthesia Post-op Follow-up Note (Signed)
Anesthesia QCDR form completed.        

## 2017-10-04 NOTE — H&P (Signed)
Primary Care Physician:  Sallee Lange, NP Primary Gastroenterologist:  Dr. Vira Agar  Pre-Procedure History & Physical:HPI:  Sherry Paul is a 72 y.o. female is here for a colonoscopy  For previous PH of colon polyps.   Past Medical History:  Diagnosis Date  . Cancer ([place])    thyroid ca  . Hyperlipidemia   . Onychomycosis   . Osteoporosis   . Sleep apnea     Past Surgical History:  Procedure Laterality Date  . CESAREAN SECTION    . ESOPHAGOGASTRODUODENOSCOPY N/A 06/25/2014   Procedure: ESOPHAGOGASTRODUODENOSCOPY (EGD);  Surgeon: Manya Silvas, MD;  Location: [facility] ENDOSCOPY;  Service: Endoscopy;  Laterality: N/A;  . monitoring cranial nerves bilateral    . SAVORY DILATION N/A 06/25/2014   Procedure: SAVORY DILATION;  Surgeon: Manya Silvas, MD;  Location: [facility] ENDOSCOPY;  Service: Endoscopy;  Laterality: N/A;  . TONSILLECTOMY    . TOTAL THYROIDECTOMY      Prior to Admission medications   Medication Sig Start Date End Date Taking? Authorizing Provider  ascorbic acid (VITAMIN C) 500 MG tablet Take 500 mg by mouth daily.   Yes [provider]  Calcium Carb-Cholecalciferol (CALCIUM 600 + D PO) Take 1 tablet by mouth 2 (two) times daily.   Yes [provider]  Coenzyme Q10 200 MG capsule Take 1 mg by mouth 2 (two) times daily.   Yes [provider]  Glucosamine Sulfate 1000 MG CAPS Take 1 capsule by mouth 2 (two) times daily.   Yes [provider]  ibuprofen (ADVIL,MOTRIN) 200 MG tablet Take 200-400 mg by mouth every 6 (six) hours as needed for moderate pain.   Yes [provider]  levothyroxine (SYNTHROID, LEVOTHROID) 150 MCG tablet Take 150-225 mcg by mouth daily. 1 tab 6 days a week, 225 mcg on Sunday   Yes [provider]  lovastatin (MEVACOR) 40 MG tablet Take 40 mg by mouth at bedtime.   Yes [provider]  Multiple Vitamin (MULTIVITAMIN WITH MINERALS) TABS tablet Take 1 tablet by mouth daily.    Yes [provider]  Multiple Vitamins-Minerals (PRESERVISION AREDS 2 PO) Take 2 tablets by mouth daily.   Yes [provider]  Phosphatidylserine 100 MG CAPS Take 1 tablet by mouth 2 (two) times daily.   Yes [provider]  psyllium (REGULOID) 0.52 G capsule Take 5 capsules by mouth 2 (two) times daily.   Yes [provider]  [facility] Wort 300 MG CAPS Take 1 capsule by mouth 2 (two) times daily.   Yes [provider]  raloxifene (EVISTA) 60 MG tablet Take 60 mg by mouth daily.    [provider]    Allergies as of 08/23/2017 - Review Complete 06/25/2014  Allergen Reaction Noted  . Codeine Other (See Comments) 06/14/2014    Family History  Problem Relation Age of Onset  . Breast cancer Neg Hx     Social History   Socioeconomic History  . Marital status: Married    Spouse name: Not on file  . Number of children: Not on file  . Years of education: Not on file  . Highest education level: Not on file  Occupational History  . Not on file  Social Needs  . Financial resource strain: Not on file  . Food insecurity:    Worry: Not on file    Inability: Not on file  . Transportation needs:    Medical: Not on file    Non-medical: Not on  file  Tobacco Use  . Smoking status: Former Research scientist (life sciences)  . Smokeless tobacco: Never Used  Substance and Sexual Activity  . Alcohol use: Yes  . Drug use: Not Currently    Comment: used at age 44  . Sexual activity: Not on file  Lifestyle  . Physical activity:    Days per week: Not on file    Minutes per session: Not on file  . Stress: Not on file  Relationships  . Social connections:    Talks on phone: Not on file    Gets together: Not on file    Attends religious service: Not on file    Active member of club or organization: Not on file    Attends meetings of clubs or organizations: Not on file    Relationship status: Not on file  . Intimate partner violence:    Fear of current or ex  partner: Not on file    Emotionally abused: Not on file    Physically abused: Not on file    Forced sexual activity: Not on file  Other Topics Concern  . Not on file  Social History Narrative  . Not on file    Review of Systems: See HPI, otherwise negative ROS  Physical Exam: BP 117/71   Pulse 74   Temp (!) 95.8 F (35.4 C) (Tympanic)   Resp 18   Ht 5\' 1"  (1.549 m)   Wt 89.8 kg   SpO2 99%   BMI 37.41 kg/m  General:   Alert,  pleasant and cooperative in NAD Head:  Normocephalic and atraumatic. Neck:  Supple; no masses or thyromegaly. Lungs:  Clear throughout to auscultation.    Heart:  Regular rate and rhythm. Abdomen:  Soft, nontender and nondistended. Normal bowel sounds, without guarding, and without rebound.   Neurologic:  Alert and  oriented x4;  grossly normal neurologically.  Impression/Plan: Sherry Paul is here for an colonoscopy to be performed for [facility] of colon polyps.  Risks, benefits, limitations, and alternatives regarding  colonoscopy have been reviewed with the patient.  Questions have been answered.  All parties agreeable.   Gaylyn Cheers, MD  10/04/2017, 10:18 AM

## 2017-10-04 NOTE — Transfer of Care (Signed)
Immediate Anesthesia Transfer of Care Note  Patient: Sherry Paul  Procedure(s) Performed: COLONOSCOPY WITH PROPOFOL (N/A )  Patient Location: PACU  Anesthesia Type:General  Level of Consciousness: sedated  Airway & Oxygen Therapy: Patient Spontanous Breathing and Patient connected to nasal cannula oxygen  Post-op Assessment: Report given to RN and Post -op Vital signs reviewed and stable  Post vital signs: Reviewed and stable  Last Vitals:  Vitals Value Taken Time  BP    Temp    Pulse    Resp    SpO2      Last Pain:  Vitals:   10/04/17 1056  TempSrc: (P) Tympanic  PainSc:          Complications: No apparent anesthesia complications

## 2017-10-04 NOTE — Op Note (Signed)
[facility] Gastroenterology Patient Name: Sherry Paul Procedure Date: 10/04/2017 10:22 AM MRN: [id number] Account #: [id number] Date of Birth: 1945/09/05 Admit Type: Outpatient Age: 72 Room: [facility] ENDO ROOM 3 Gender: Female Note Status: Finalized Procedure:            Colonoscopy Indications:          High risk colon cancer surveillance: Personal history                        of colonic polyps Providers:            Manya Silvas, MD Referring MD:         Juluis Rainier (Referring MD) Medicines:            Propofol per Anesthesia Complications:        No immediate complications. Procedure:            Pre-Anesthesia Assessment:                       - After reviewing the risks and benefits, the patient                        was deemed in satisfactory condition to undergo the                        procedure.                       After obtaining informed consent, the colonoscope was                        passed under direct vision. Throughout the procedure,                        the patient's blood pressure, pulse, and oxygen                        saturations were monitored continuously. The                        Colonoscope was introduced through the anus and                        advanced to the the cecum, identified by appendiceal                        orifice and ileocecal valve. The colonoscopy was                        performed without difficulty. The patient tolerated the                        procedure well. The quality of the bowel preparation                        was good. Findings:      A diminutive polyp was found in the cecum. The polyp was sessile. The       polyp was removed with a jumbo cold forceps. Resection and retrieval       were complete.      A diminutive polyp was found in  the descending colon. The polyp was       sessile. The polyp was removed with a jumbo cold forceps. Resection and       retrieval were complete.  Two sessile polyps were found in the sigmoid colon. The polyps were       diminutive in size. These polyps were removed with a jumbo cold forceps.       Resection and retrieval were complete.      A diminutive polyp was found in the rectum. The polyp was sessile. The       polyp was removed with a jumbo cold forceps. Resection and retrieval       were complete.      Two sessile polyps were found in the rectum. The polyps were diminutive       in size. These polyps were removed with a jumbo cold forceps. Resection       and retrieval were complete.      Internal hemorrhoids were found during endoscopy. The hemorrhoids were       small and Grade I (internal hemorrhoids that do not prolapse). Impression:           - One diminutive polyp in the cecum, removed with a                        jumbo cold forceps. Resected and retrieved.                       - One diminutive polyp in the descending colon, removed                        with a jumbo cold forceps. Resected and retrieved.                       - Two diminutive polyps in the sigmoid colon, removed                        with a jumbo cold forceps. Resected and retrieved.                       - One diminutive polyp in the rectum, removed with a                        jumbo cold forceps. Resected and retrieved.                       - Two diminutive polyps in the rectum, removed with a                        jumbo cold forceps. Resected and retrieved.                       - Internal hemorrhoids. Recommendation:       - Await pathology results. Manya Silvas, MD 10/04/2017 10:59:54 AM This report has been signed electronically. Number of Addenda: 0 Note Initiated On: 10/04/2017 10:22 AM Scope Withdrawal Time: 0 hours 18 minutes 59 seconds  Total Procedure Duration: 0 hours 25 minutes 5 seconds       [facility]

## 2017-10-04 NOTE — Anesthesia Preprocedure Evaluation (Signed)
Anesthesia Evaluation  Patient identified by MRN, date of birth, ID band Patient awake    Reviewed: Allergy & Precautions, NPO status , Patient's Chart, lab work & pertinent test results, reviewed documented beta blocker date and time   Airway Mallampati: II  TM Distance: >3 FB     Dental  (+) Chipped   Pulmonary sleep apnea , former smoker,           Cardiovascular      Neuro/Psych    GI/Hepatic   Endo/Other    Renal/GU      Musculoskeletal   Abdominal   Peds  Hematology   Anesthesia Other Findings Meningioma brain stem, followed at [facility].  Reproductive/Obstetrics                             Anesthesia Physical Anesthesia Plan  ASA: III  Anesthesia Plan: General   Post-op Pain Management:    Induction: Intravenous  PONV Risk Score and Plan:   Airway Management Planned:   Additional Equipment:   Intra-op Plan:   Post-operative Plan:   Informed Consent: I have reviewed the patients History and Physical, chart, labs and discussed the procedure including the risks, benefits and alternatives for the proposed anesthesia with the patient or authorized representative who has indicated his/her understanding and acceptance.     Plan Discussed with: CRNA  Anesthesia Plan Comments:         Anesthesia Quick Evaluation

## 2017-10-04 NOTE — Anesthesia Postprocedure Evaluation (Signed)
Anesthesia Post Note  Patient: Sherry Paul  Procedure(s) Performed: COLONOSCOPY WITH PROPOFOL (N/A )  Patient location during evaluation: Endoscopy Anesthesia Type: General Level of consciousness: awake and alert Pain management: pain level controlled Vital Signs Assessment: post-procedure vital signs reviewed and stable Respiratory status: spontaneous breathing, nonlabored ventilation, respiratory function stable and patient connected to nasal cannula oxygen Cardiovascular status: blood pressure returned to baseline and stable Postop Assessment: no apparent nausea or vomiting Anesthetic complications: no     Last Vitals:  Vitals:   10/04/17 1116 10/04/17 1126  BP: 104/69 (!) 95/49  Pulse: 85 80  Resp: (!) 21 (!) 25  Temp:    SpO2: 100% 100%    Last Pain:  Vitals:   10/04/17 1126  TempSrc:   PainSc: 0-No pain                 [name]

## 2017-10-05 ENCOUNTER — Encounter: Payer: Self-pay | Admitting: Unknown Physician Specialty

## 2017-10-05 LAB — SURGICAL PATHOLOGY

## 2018-09-27 DIAGNOSIS — M25561 Pain in right knee: Secondary | ICD-10-CM | POA: Insufficient documentation

## 2018-09-27 DIAGNOSIS — M6281 Muscle weakness (generalized): Secondary | ICD-10-CM | POA: Insufficient documentation

## 2019-06-15 DIAGNOSIS — D320 Benign neoplasm of cerebral meninges: Secondary | ICD-10-CM | POA: Insufficient documentation

## 2020-01-09 ENCOUNTER — Other Ambulatory Visit: Payer: Self-pay | Admitting: Orthopedic Surgery

## 2020-01-09 DIAGNOSIS — M25551 Pain in right hip: Secondary | ICD-10-CM

## 2020-01-09 DIAGNOSIS — M1611 Unilateral primary osteoarthritis, right hip: Secondary | ICD-10-CM

## 2020-01-17 ENCOUNTER — Other Ambulatory Visit: Payer: Self-pay

## 2020-01-17 ENCOUNTER — Ambulatory Visit
Admission: RE | Admit: 2020-01-17 | Discharge: 2020-01-17 | Disposition: A | Discharge status: Home / Self Care | Payer: Medicare Other | Source: Ambulatory Visit | Attending: Orthopedic Surgery | Admitting: Orthopedic Surgery

## 2020-01-17 DIAGNOSIS — M1611 Unilateral primary osteoarthritis, right hip: Secondary | ICD-10-CM | POA: Diagnosis present

## 2020-01-17 DIAGNOSIS — M25551 Pain in right hip: Secondary | ICD-10-CM | POA: Insufficient documentation

## 2020-01-17 DIAGNOSIS — G8929 Other chronic pain: Secondary | ICD-10-CM | POA: Insufficient documentation

## 2020-01-17 IMAGING — MR MR HIP*R* W/O CM
5 series · 40 of 40 positions shown · non-contrast
Comparison: None.

CLINICAL DATA: Right hip pain and weakness resulting and altered
gait for 2-3 months. No known injury.

EXAM:
MR OF THE RIGHT HIP WITHOUT CONTRAST
TECHNIQUE: Multiplanar, multisequence MR imaging was performed. No intravenous
contrast was administered.

[Series 2: STIR · coronal · 4.0mm · 1.48mm/px · 10 of 41 slices shown (1 of 2)]
[im 1/41]
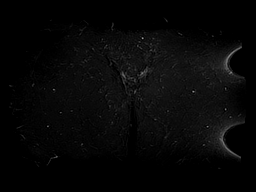
[im 5/41]
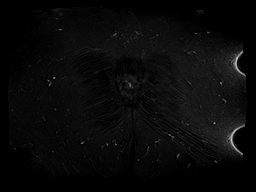
[im 9/41]
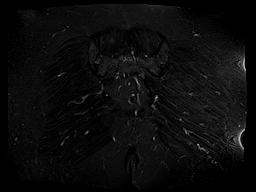
[im 14/41]
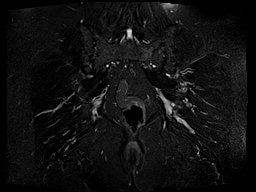
[im 18/41]
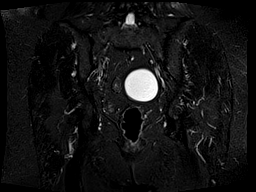
[im 23/41]
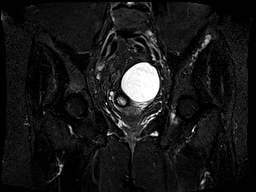
[im 27/41]
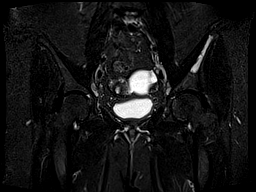
[im 32/41]
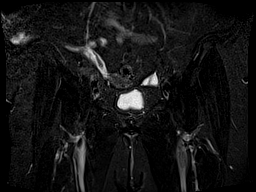
[im 36/41]
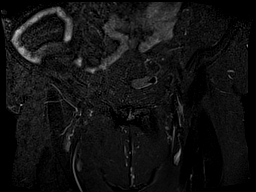
[im 41/41]
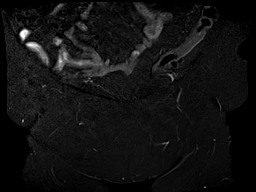

[Series 3: T1 · coronal · 4.0mm · 1.48mm/px · 10 of 41 slices shown]
[im 1/41]
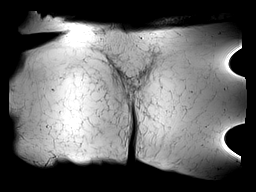
[im 5/41]
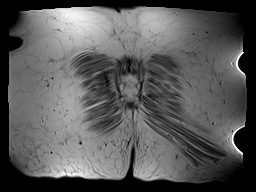
[im 9/41]
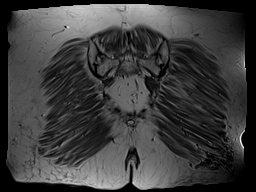
[im 14/41]
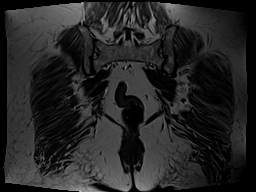
[im 18/41]
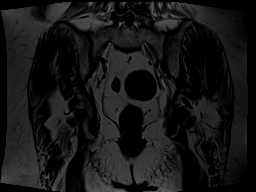
[im 23/41]
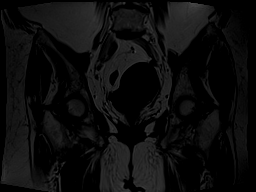
[im 27/41]
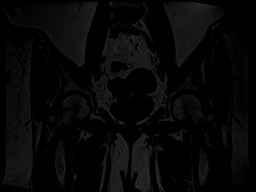
[im 32/41]
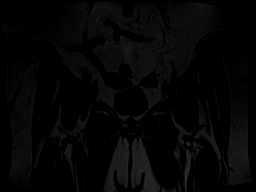
[im 36/41]
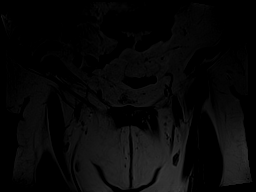
[im 41/41]
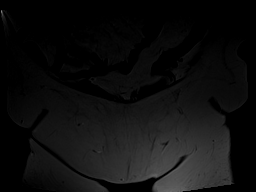

[Series 4: STIR · axial · 4.0mm · 1.48mm/px · z∈[-90,+115]mm · 10 of 42 slices shown (2 of 2)]
[im 1/42]
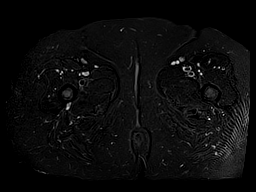
[im 5/42]
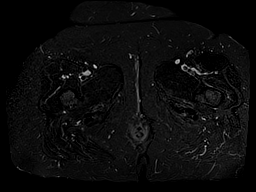
[im 10/42]
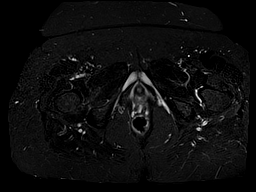
[im 14/42]
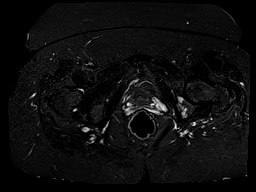
[im 19/42]
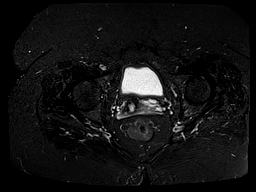
[im 23/42]
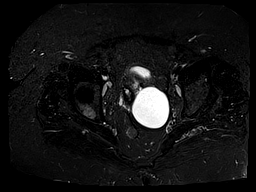
[im 28/42]
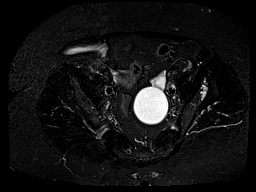
[im 32/42]
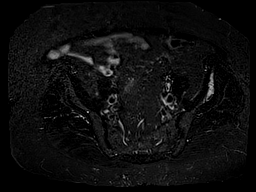
[im 37/42]
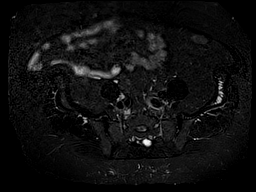
[im 42/42]
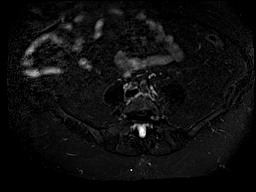

[Series 5: PD fat-sat · sagittal · 4.5mm · 0.35mm/px · 5 of 23 slices shown (1 of 2)]
[im 1/23]
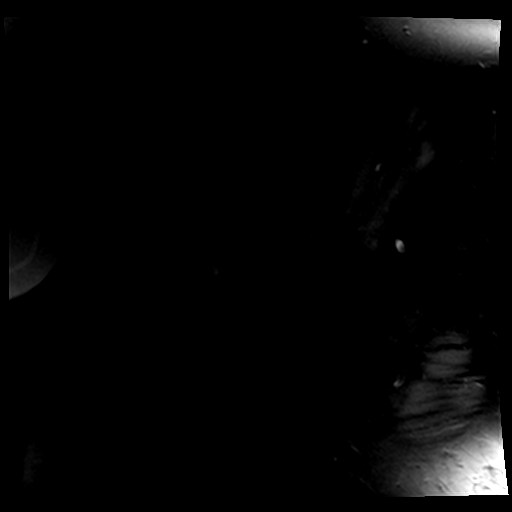
[im 6/23]
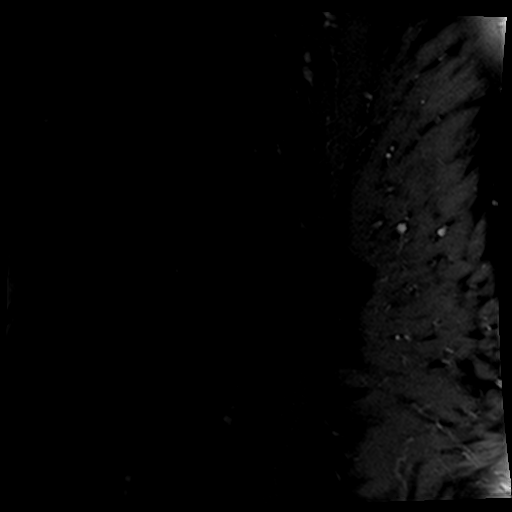
[im 12/23]
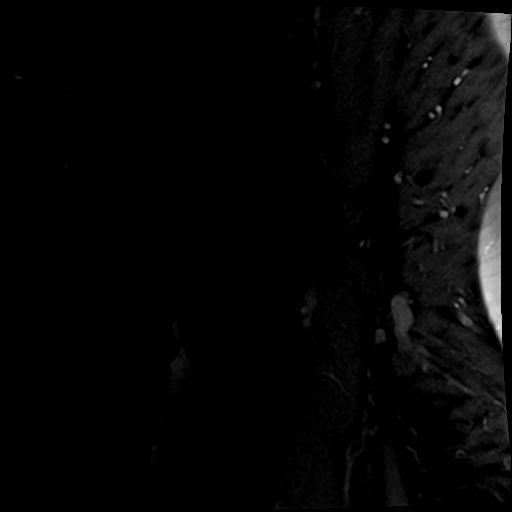
[im 17/23]
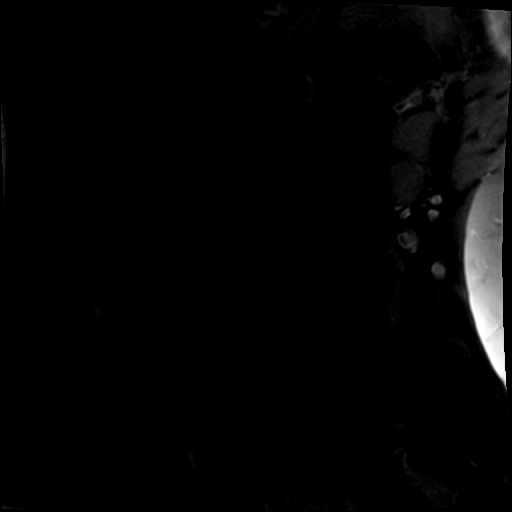
[im 23/23]
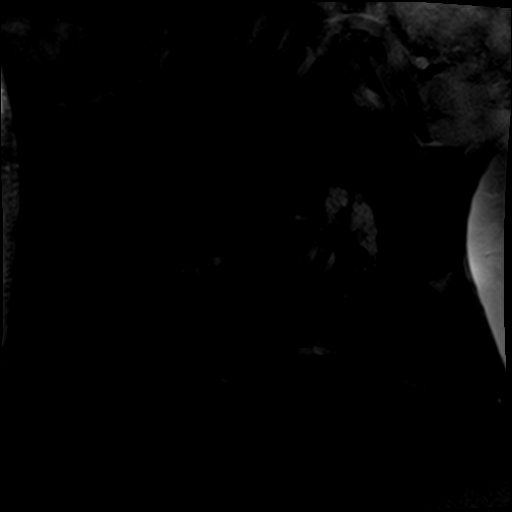

[Series 6: PD fat-sat · coronal · 4.5mm · 0.35mm/px · 5 of 23 slices shown (2 of 2)]
[im 1/23]
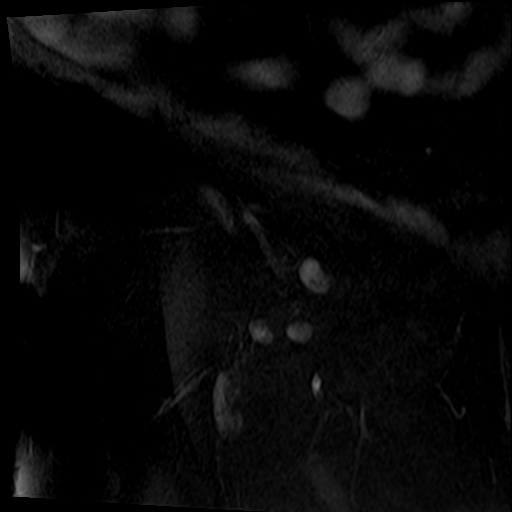
[im 6/23]
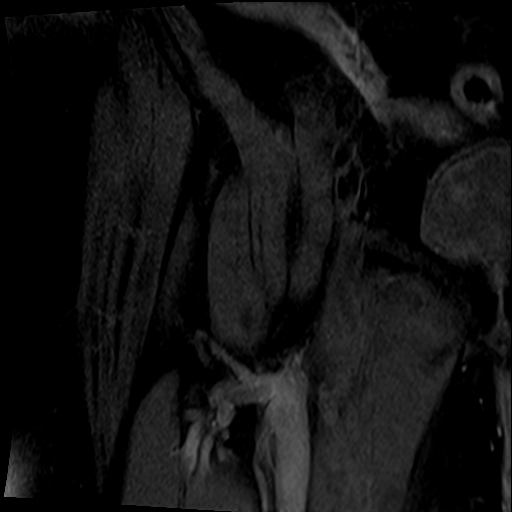
[im 12/23]
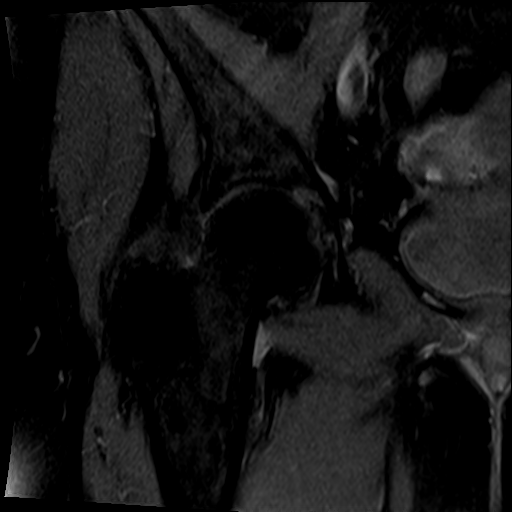
[im 17/23]
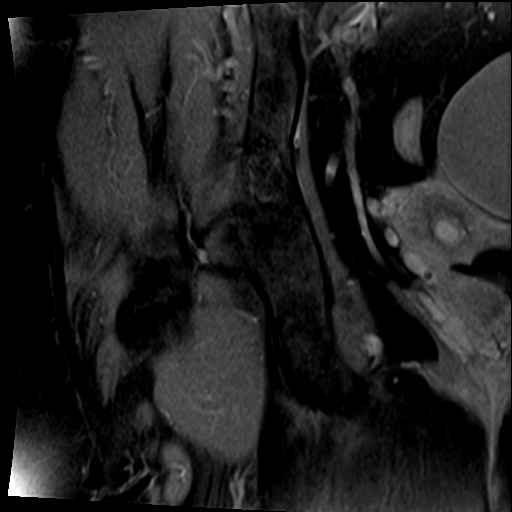
[im 23/23]
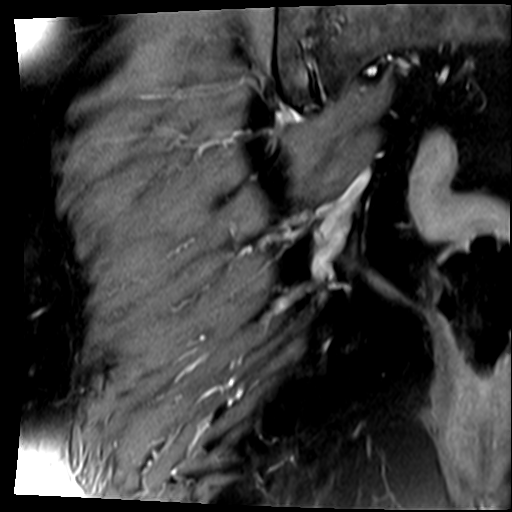

[40 of 40 positions shown; findings below may reference images not displayed]

FINDINGS: Bones: There is a lesion in the left iliac wing which measures
approximately 7 cm craniocaudal by 4.5 cm AP x 0.9 cm transverse.
The lesion is predominantly T2 hyperintense with foci of markedly
decreased T1 and T2 signal within it. No cortical break is
identified and no adjacent soft tissue mass is seen. Adjacent
musculature is normal in appearance. The patient also has a lesion
in the posterior aspect of the right acetabulum measuring
approximately 3 cm craniocaudal x 3 cm AP x 2 cm transverse. This
lesion is mildly T2 hyperintense with large areas of signal dropout
and T1 hyperintensity. Finally, there is a lesion in the left
ischium measuring approximately 4 cm craniocaudal x 2 cm transverse
x 2.6 cm AP. This lesion is predominantly T1 hyperintense with
linear areas of T2 hyperintensity within it.

The hips demonstrate normal signal without fracture, stress change,
subchondral cyst formation or edema. No avascular necrosis of the
femoral heads.

Articular cartilage and labrum

Articular cartilage:  Normal.

Labrum:  Intact.

Joint or bursal effusion

Joint effusion:  None.

Bursae: Negative.

Muscles and tendons

Muscles and tendons:  Intact and normal in appearance.

Other findings

Miscellaneous: Imaged intrapelvic contents demonstrate a left
adnexal T2 hyperintense lesion measuring 6.4 cm AP x 6.1 cm
transverse x 6.6 cm craniocaudal.
IMPRESSION: Lesion in the left ilium cannot be definitively characterized but
has features most suggestive of benign entity such as an
enchondroma, burnt out fibrous dysplasia or possibly a vascular
malformation such as a hemangioma. CT of the pelvis without contrast
is recommended for further evaluation.

Lesions in the left ischium and right acetabulum have benign
features and likely represent hemangiomas.

6.6 cm left adnexal cyst. Because this lesion is not adequately
characterized, prompt US is recommended for further evaluation.
Note: This recommendation does not apply to premenarchal patients
and to those with increased risk (genetic, family history, elevated
tumor markers or other high-risk factors) of ovarian cancer.
Reference: JACR [DATE]):[id number].

Normal appearing right hip.

## 2020-01-24 ENCOUNTER — Other Ambulatory Visit: Payer: Self-pay | Admitting: Orthopedic Surgery

## 2020-01-25 ENCOUNTER — Other Ambulatory Visit: Payer: Self-pay | Admitting: Student

## 2020-01-25 DIAGNOSIS — M899 Disorder of bone, unspecified: Secondary | ICD-10-CM

## 2020-01-31 ENCOUNTER — Other Ambulatory Visit: Payer: Self-pay

## 2020-01-31 ENCOUNTER — Ambulatory Visit
Admission: RE | Admit: 2020-01-31 | Discharge: 2020-01-31 | Disposition: A | Discharge status: Home / Self Care | Payer: Medicare Other | Source: Ambulatory Visit | Attending: Student | Admitting: Student

## 2020-01-31 DIAGNOSIS — M25551 Pain in right hip: Secondary | ICD-10-CM | POA: Insufficient documentation

## 2020-01-31 DIAGNOSIS — M899 Disorder of bone, unspecified: Secondary | ICD-10-CM | POA: Diagnosis present

## 2020-01-31 IMAGING — CT CT PELVIS W/O CM
2 of 3 series · 16 of 46 positions shown, 18 images · non-contrast
Comparison: Right hip MRI [DATE].  No other comparison studies.

CLINICAL DATA: Right hip pain. Indeterminate lesion in the left
iliac bone on recent MRI.

EXAM:
CT PELVIS WITHOUT CONTRAST
TECHNIQUE: Multidetector CT imaging of the pelvis was performed following the
standard protocol without intravenous contrast.

[Series 3: axial st · axial · 0.70mm/px · z∈[-857,-633]mm · 13 of 130 slices shown, 15 images]
[im 9/130  soft-tissue]
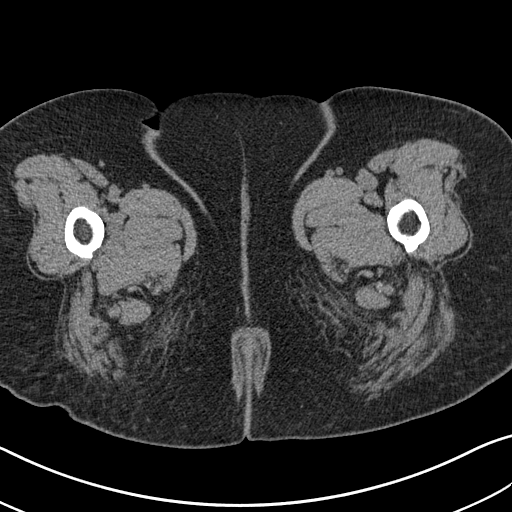
[im 9/130  bone]
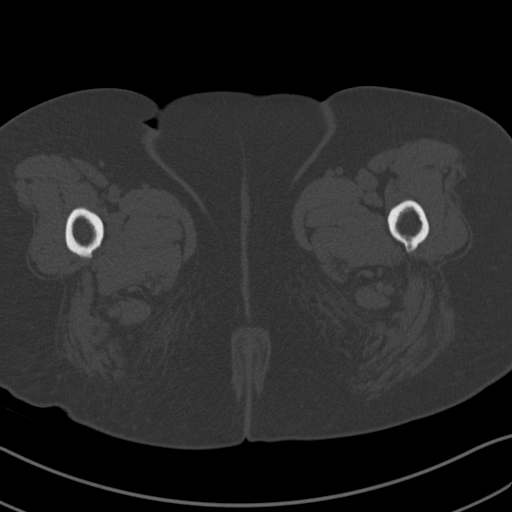
[im 17/130  soft-tissue]
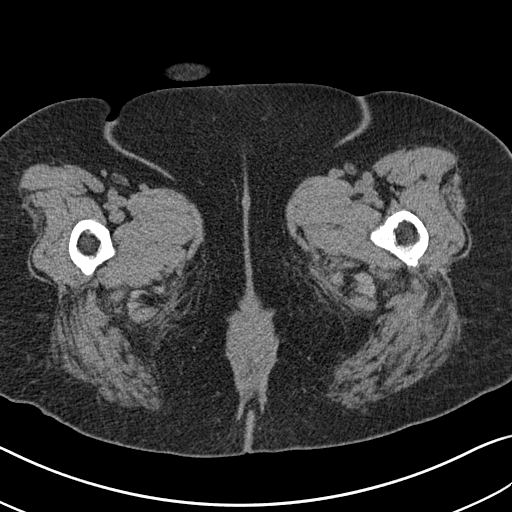
[im 25/130  soft-tissue]
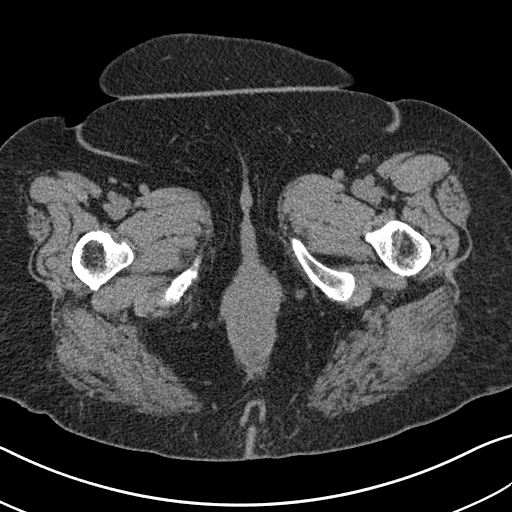
[im 38/130  soft-tissue]
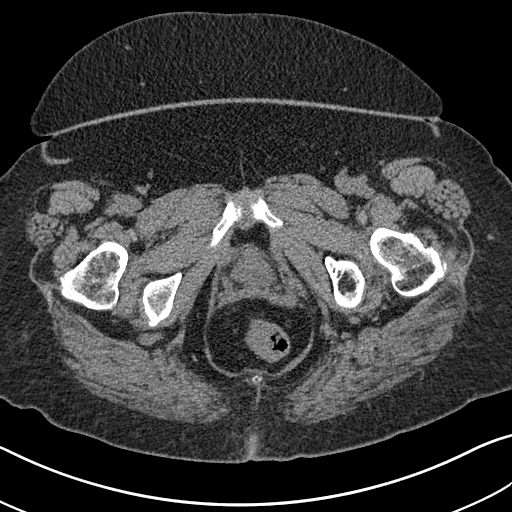
[im 46/130  soft-tissue]
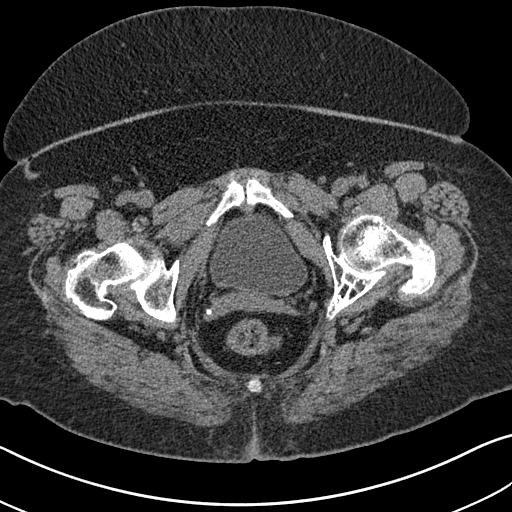
[im 55/130  soft-tissue]
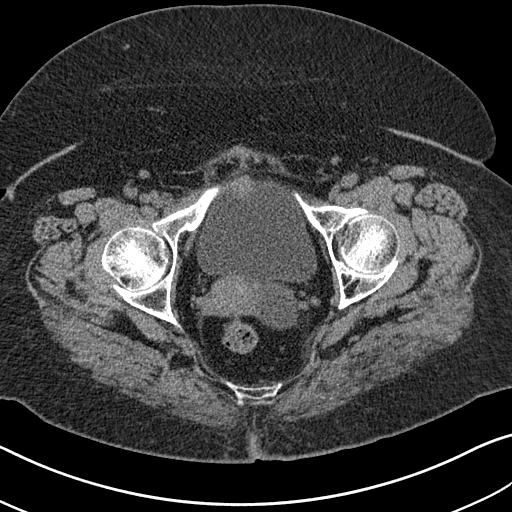
[im 67/130  soft-tissue]
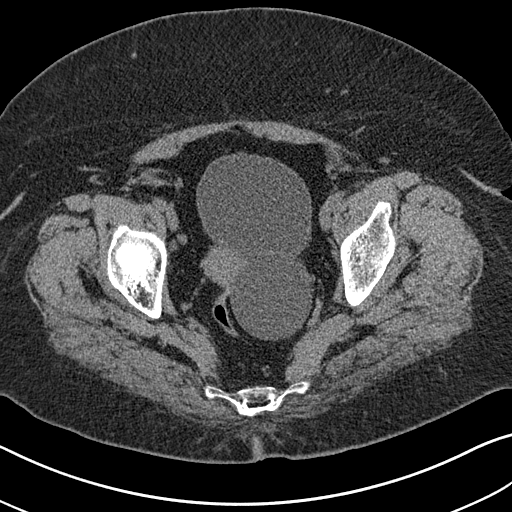
[im 75/130  soft-tissue]
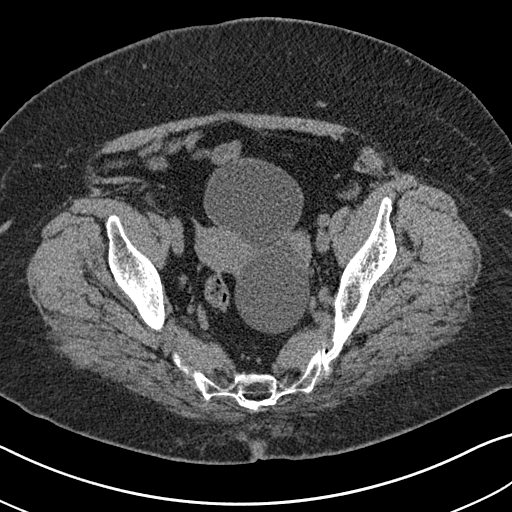
[im 84/130  soft-tissue]
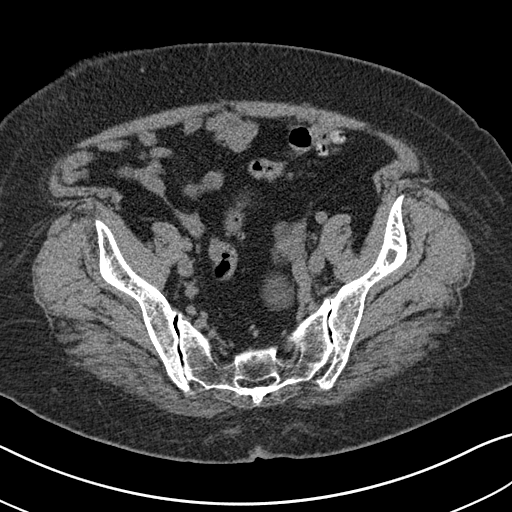
[im 84/130  bone]
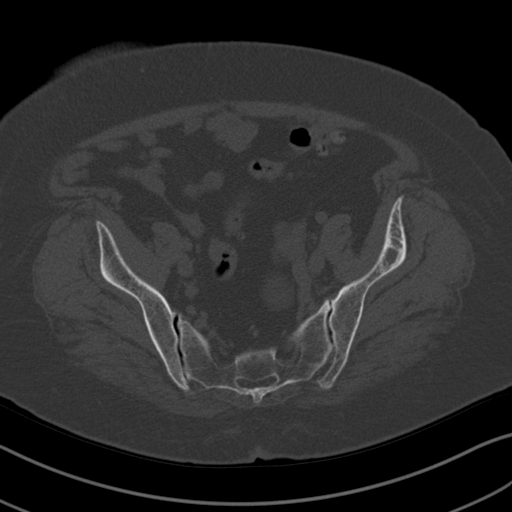
[im 92/130  soft-tissue]
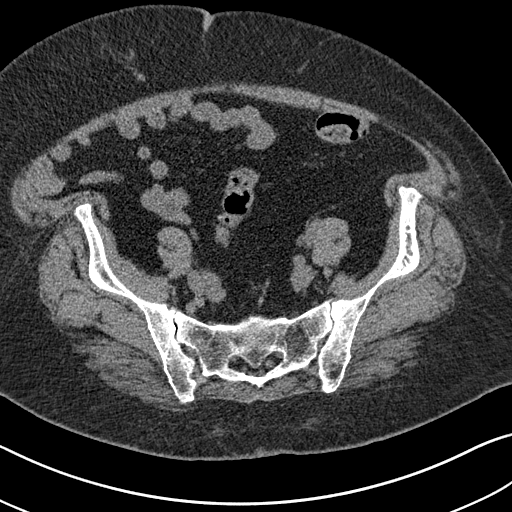
[im 105/130  soft-tissue]
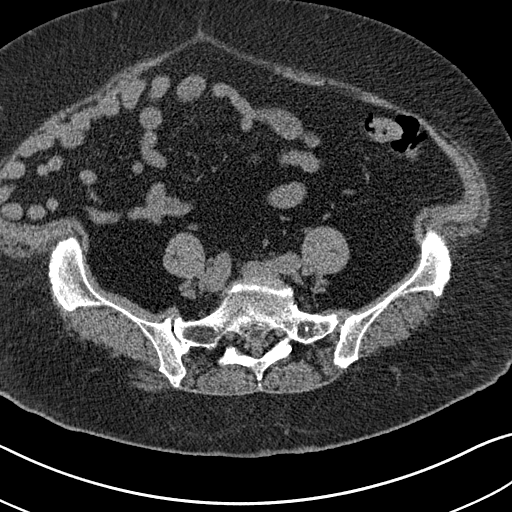
[im 113/130  soft-tissue]
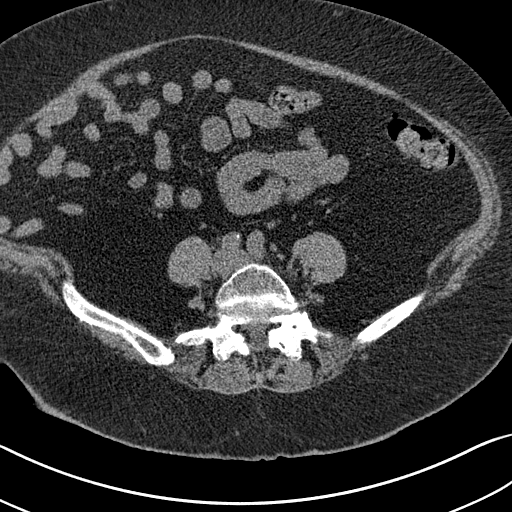
[im 121/130  soft-tissue]
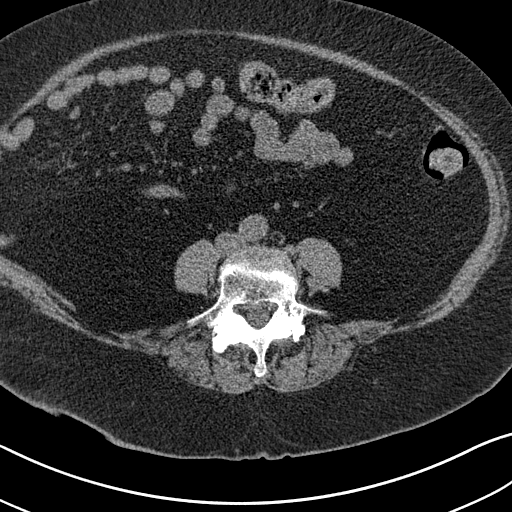

[Series 7: coronal st · coronal · 0.54mm/px · 3 of 155 slices shown]
[im 52/155  soft-tissue]
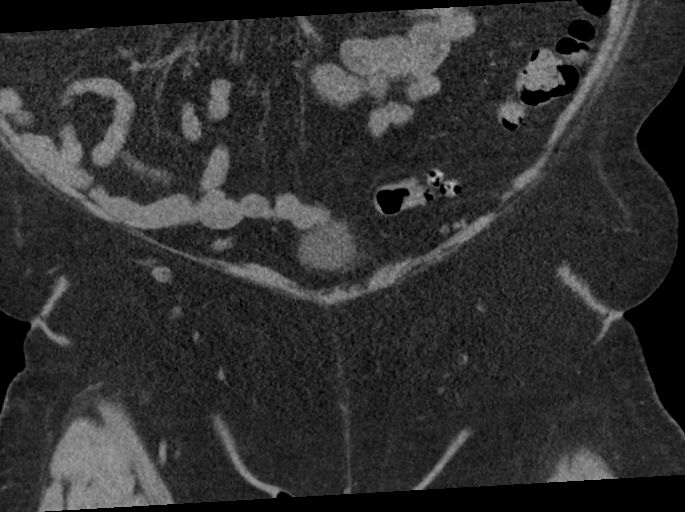
[im 69/155  soft-tissue]
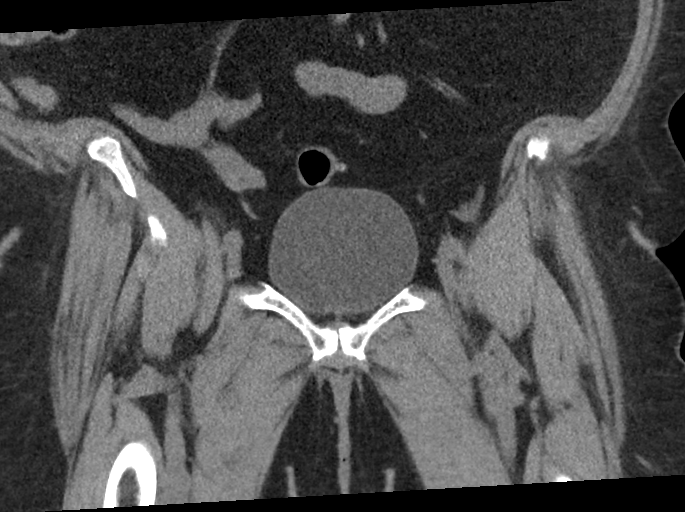
[im 86/155  soft-tissue]
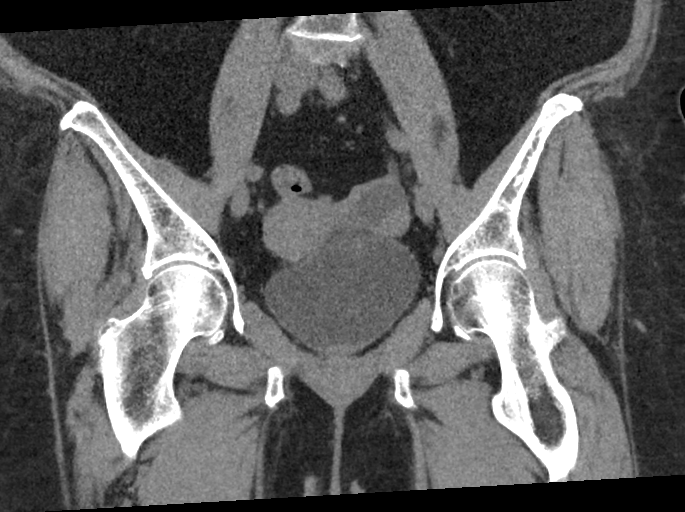

[16 of 46 positions shown; findings below may reference images not displayed]

FINDINGS: Urinary Tract: The visualized distal ureters and bladder appear
unremarkable.

Bowel: No bowel wall thickening, distention or surrounding
inflammation identified within the pelvis. Mild sigmoid
diverticulosis.

Vascular/Lymphatic: No enlarged pelvic lymph nodes identified. No
significant vascular findings on noncontrast imaging.

Reproductive: As seen on recent MRI, there is a low-density
cystic-appearing mass in the left adnexa which measures 6.1 x 5.8 x
6.0 cm. This appears unchanged from the recent MRI, without
surrounding inflammatory change. The uterus and right ovary appear
normal.

Other: No pelvic ascites.

Musculoskeletal: The previously demonstrated lesions involving the
right posterior acetabulum, left iliac bone and left ischium are all
similar in appearance and appear nonaggressive. These demonstrate
thickened trabecula and central areas of fat attenuation, likely
reflecting hemangiomas. No acute osseous findings. Mild degenerative
changes of the sacroiliac joints and symphysis pubis. More advanced
facet arthropathy within the lower lumbar spine.
IMPRESSION: 1. Stable appearance of nonaggressive appearing lesions involving
the right posterior acetabulum, left iliac bone and left ischium,
likely reflecting hemangiomas.
2. Stable low-density cystic-appearing mass in the left adnexa,
potentially a cystic ovarian neoplasm. Because this lesion is not
adequately characterized, prompt US is recommended for further
evaluation. Note: This recommendation does not apply to premenarchal
patients and to those with increased risk (genetic, family history,
elevated tumor markers or other high-risk factors) of ovarian
cancer. Reference: JACR [DATE]):[id number]

## 2020-03-14 NOTE — H&P (Signed)
Ms. Strack is a 75 y.o. female here for L/S BSO  For left ovarian enlargement.  Pt here for follow up for left ovarian cyst . 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  Past Surgical History:  has a past surgical history that includes thyroidectomy total (Bilateral, 07/27/2013); monitoring cranial nerves bilateral (Bilateral, 07/27/2013); Colonoscopy (11/27/2010); egd (06/25/2014); Cesarean section (1982); Cesarean section (1986); Tonsillectomy (1957); Colonoscopy (10/04/2017); craniectomy w/excision posterior fossa meningioma (Left, 08/07/2019); and microsurgery (Left, 08/07/2019). Family History: family history includes Abnormal EKG in her father; Atrial fibrillation (Abnormal heart rhythm sometimes requiring treatment with blood thinners) in her paternal grandfather; Cancer in her mother; Diabetes type II in her mother; Hashimoto's thyroiditis in her mother; Hearing loss in her mother; High blood pressure (Hypertension) in her mother; Kidney failure in her father; Lymphoma in her mother; Macular degeneration in her mother; Multiple myeloma in  her father; Myocardial Infarction (Heart attack) (age of onset: 989in her father; No Known Problems in her daughter, maternal grandmother, paternal grandmother, and son; Other in her father; Stroke in her brother and maternal grandfather; Thyroid disease in her mother. Social History:  reports that she quit smoking about 41 years ago. Her smoking use included cigarettes. She has a 14.00 pack-year smoking history. She has never used smokeless tobacco. She reports previous alcohol use. She reports that she does not use drugs. OB/GYN History:          OB History    Gravida  2   Para  2   Term      Preterm      AB      Living  2     SAB      IAB      Ectopic      Molar      Multiple      Live Births  2          Allergies: is allergic to codeine and omeprazole. Medications:  Current Outpatient Medications:  .  ascorbic acid, vitamin C, (VITAMIN C) 500 MG tablet, Take 500 mg by mouth 2 (two) times daily, Disp: , Rfl:  .  ASHWAGANDHA ROOT EXTRACT ORAL, Take by mouth, Disp: , Rfl:  .  bilberry 100 mg Cap, Take by mouth, Disp: , Rfl:  .  calcium carbonate (CALCIUM 600 ORAL), Take by mouth 2 (two) times daily, Disp: , Rfl:  .  cholecalciferol (VITAMIN D3) 2,000 unit capsule, Take 2,000 Units by mouth once daily, Disp: , Rfl:  .  GINKGO BILOBA ORAL, Take 1,000 mg by mouth 2 (two) times daily, Disp: , Rfl:  .  ginkgo biloba xt-bacopa lf ext (GINKGO BILOBA PLUS, BACOPA,) 120-40 mg Cap, Take by mouth 2 (two) times daily   , Disp: , Rfl:  .  GLUCOSAMINE HCL ORAL, Take 500 mg by mouth 4 (four) times daily   , Disp: ,  Rfl:  .  Herbal Supplement, Herbal Name: Neuro-plus 100 mg twice daily  , Disp: , Rfl:  .  huperzine serrate A, bulk, 1 % Powd, Use, Disp: , Rfl:  .  ibandronate (BONIVA) 150 mg tablet, TAKE 1 TABLET (150 MG TOTAL) BY MOUTH EVERY 30 (THIRTY) DAYS TAKE WITH A FULL GLASS OF WATER. DO NOT LIE DOWN FOR THE NEXT 60 MIN., Disp: 3 tablet, Rfl: 1 .  levothyroxine  (SYNTHROID) 150 MCG tablet, Take one tablet by mouth 6 days per week. take 1/2 tablet (75 mcg) every Saturday, Disp: 80 tablet, Rfl: 3 .  lovastatin (MEVACOR) 40 MG tablet, TAKE 1 TABLET BY MOUTH DAILY WITH DINNER, Disp: 90 tablet, Rfl: 2 .  multivitamin tablet, Take 1 tablet by mouth once daily, Disp: , Rfl:  .  omega-3 acid ethyl esters (LOVAZA) 1 gram capsule, Take 1 g by mouth once daily   , Disp: , Rfl:  .  psyllium seed, with sugar, (FIBER ORAL), Take by mouth once daily, Disp: , Rfl:  .  rutin/quercetin/bioflav/bilber (BILBERRY EXTRACT ORAL), Take 1,000 mg by mouth once daily, Disp: , Rfl:  .  St. John's wort 300 mg Tab, Take by mouth 2 (two) times daily, Disp: , Rfl:  .  vit C/E/Zn/coppr/lutein/zeaxan (PRESERVISION AREDS-2 ORAL), Take by mouth 2 (two) times daily, Disp: , Rfl:  .  vitamin E acetate (VITAMIN E ORAL), Take 400 Units by mouth once daily   , Disp: , Rfl:  .  apoaequorin (PREVAGEN) capsule, Take by mouth every morning    (Patient not taking: Reported on 02/06/2020  ), Disp: , Rfl:  .  co-enzyme Q-10, ubiquinone, (CO Q-10) 100 mg capsule, Take 100 mg by mouth 4 (four) times daily, Disp: , Rfl:   Review of Systems: General:                      No fatigue or weight loss Eyes:                           No vision changes Ears:                            No hearing difficulty Respiratory:                No cough or shortness of breath Pulmonary:                  No asthma or shortness of breath Cardiovascular:           No chest pain, palpitations, dyspnea on exertion Gastrointestinal:          No abdominal bloating, chronic diarrhea, constipations, masses, pain or hematochezia Genitourinary:             No hematuria, dysuria, abnormal vaginal discharge, pelvic pain, Menometrorrhagia Lymphatic:                   No swollen lymph nodes Musculoskeletal:         No muscle weakness Neurologic:                  No extremity weakness, syncope, seizure disorder Psychiatric:                   No history of depression, delusions or suicidal/homicidal ideation    Exam:      Vitals:   02/29/20 1410  BP: 114/75  Pulse: 83    Body mass index is 37.41 kg/m.  WDWN white/ k female in NAD   Lungs: CTA  CV : RRR without murmur    Neck:  no thyromegaly Abdomen: soft , no mass, normal active bowel sounds,  non-tender, no rebound tenderness Pelvic: tanner stage 5 ,  External genitalia: vulva /labia no lesions Urethra: no prolapse Vagina: normal physiologic d/c Cervix: no lesions, no cervical motion tenderness   Uterus: normal size shape and contour, non-tender Adnexa: no mass,  non-tender     Impression:   The encounter diagnosis was Left ovarian cyst.  8x5 cm , asymptomatic , but large enough to cuase torsion .   normal CA125  Plan:     recommend l/s BSO  I have explained the procedure and possible risks She agrees to the procedure  Benefits and risks to surgery: The proposed benefit of the surgery has been discussed with the patient. The possible risks include, but are not limited to: organ injury to the bowel , bladder, ureters, and major blood vessels and nerves. There is a possibility of additional surgeries resulting from these injuries. There is also the risk of blood transfusion and the need to receive blood products during or after the procedure which may rarely lead to HIV or Hepatitis C infection. There is a risk of developing a deep venous thrombosis or a pulmonary embolism . There is the possibility of wound infection and also anesthetic complications, even the rare possibility of death. The patient understands these risks and wishes to proceed. All questions have been answered      THOMAS JANSE SCHERMERHORN, MD    

## 2020-03-19 NOTE — H&P (Signed)
SherryOlsonis a 75 y.o.femalehere for L/S BSO  For left ovarian enlargement.  Pt here for follow up for left ovarian cyst . 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 Family History:family history includes Abnormal EKG in her father; Atrial fibrillation (Abnormal heart rhythm sometimes requiring treatment with blood thinners) in her paternal grandfather; Cancer in her mother; Diabetes type II in her mother; Hashimoto's thyroiditis in her mother; Hearing loss in her mother; High blood pressure (Hypertension) in her mother; Kidney failure in her father; Lymphoma in her mother; Macular degeneration in her mother; Multiple myeloma in  her father; Myocardial Infarction (Heart attack) (age of onset: [age]in her father; No Known Problems in her daughter, maternal grandmother, paternal grandmother, and son; Other in her father; Stroke in her brother and maternal grandfather; Thyroid disease in her mother. Social History:reports that she quit smoking about 41 years ago. Her smoking use included cigarettes. She has a 14.00 pack-year smoking history. She has never used smokeless tobacco. She reports previous alcohol use. She reports that she does not use drugs. OB/GYN History:                         OB History    Gravida  2   Para  2   Term     Preterm     AB     Living  2     SAB     IAB     Ectopic     Molar     Multiple     Live Births  2         Allergies:is allergic to codeine and omeprazole. Medications:  Current Outpatient Medications:  .ascorbic acid, vitamin C, (VITAMIN C) 500 MG tablet, Take 500 mg by mouth 2 (two) times daily, Disp: , Rfl:  .ASHWAGANDHA ROOT EXTRACT ORAL, Take by mouth, Disp: , Rfl:  .bilberry 100 mg Cap, Take by mouth, Disp: , Rfl:  .calcium carbonate (CALCIUM 600 ORAL), Take by mouth 2 (two) times daily, Disp: , Rfl:  .cholecalciferol (VITAMIN D3) 2,000 unit capsule, Take 2,000 Units by mouth once daily, Disp: , Rfl:  .GINKGO BILOBA ORAL, Take 1,000 mg by mouth 2 (two) times daily, Disp: , Rfl:  .ginkgo biloba xt-bacopa lf ext (GINKGO BILOBA PLUS, BACOPA,) 120-40 mg Cap, Take by mouth 2 (two) times daily , Disp: , Rfl:  .GLUCOSAMINE HCL ORAL, Take 500 mg by mouth 4 (four) times daily , Disp: , Rfl:  .Herbal Supplement, Herbal Name: Neuro-plus 100 mg twice daily , Disp: , Rfl:  .huperzine serrate A, bulk, 1 % Powd, Use, Disp: , Rfl:  .  ibandronate (BONIVA) 150 mg tablet, TAKE 1 TABLET (150 MG TOTAL) BY MOUTH EVERY 30 (THIRTY) DAYS TAKE WITH A FULL GLASS OF WATER. DO NOT LIE DOWN FOR THE NEXT 60 MIN., Disp: 3 tablet, Rfl:  1 .levothyroxine (SYNTHROID) 150 MCG tablet, Take one tablet by mouth 6 days per week. take 1/2 tablet (75 mcg) every Saturday, Disp: 80 tablet, Rfl: 3 .lovastatin (MEVACOR) 40 MG tablet, TAKE 1 TABLET BY MOUTH DAILY WITH DINNER, Disp: 90 tablet, Rfl: 2 .multivitamin tablet, Take 1 tablet by mouth once daily, Disp: , Rfl:  .omega-3 acid ethyl esters (LOVAZA) 1 gram capsule, Take 1 g by mouth once daily , Disp: , Rfl:  .psyllium seed, with sugar, (FIBER ORAL), Take by mouth once daily, Disp: , Rfl:  .rutin/quercetin/bioflav/bilber (BILBERRY EXTRACT ORAL), Take 1,000 mg by mouth once daily, Disp: , Rfl:  .St. John's wort 300 mg Tab, Take by mouth 2 (two) times daily, Disp: , Rfl:  .vit C/E/Zn/coppr/lutein/zeaxan (PRESERVISION AREDS-2 ORAL), Take by mouth 2 (two) times daily, Disp: , Rfl:  .vitamin E acetate (VITAMIN E ORAL), Take 400 Units by mouth once daily , Disp: , Rfl:  .apoaequorin (PREVAGEN) capsule, Take by mouth every morning (Patient not taking: Reported on 02/06/2020 ), Disp: , Rfl:  .co-enzyme Q-10, ubiquinone, (CO Q-10) 100 mg capsule, Take 100 mg by mouth 4 (four) times daily, Disp: , Rfl:   Review of Systems: General: No fatigue or weightloss Eyes:No vision changes Ears:No hearing difficulty Respiratory:No cough or shortness of breath Pulmonary: No asthma or shortness of breath Cardiovascular:No chest pain, palpitations, dyspnea on exertion Gastrointestinal:No abdominal bloating, chronic diarrhea, constipations, masses, pain or hematochezia Genitourinary:No hematuria, dysuria, abnormal vaginal discharge, pelvic pain, Menometrorrhagia Lymphatic:No swollen lymph nodes Musculoskeletal:No muscle weakness Neurologic:No extremity weakness, syncope, seizure  disorder Psychiatric:No history of depression, delusions or suicidal/homicidal ideation   Exam:      Vitals:   3/1/221410  BP: 128/81  Pulse: 83    Body mass index is 37.41 kg/m.  WDWN white/ k female in NAD  Lungs: CTA  CV: RRR without murmur   Neck: no thyromegaly Abdomen: soft , no mass, normal active bowel sounds, non-tender, no rebound tenderness Pelvic: tanner stage 5 ,  External genitalia: vulva /labia no lesions Urethra: no prolapse Vagina: normal physiologic d/c Cervix: no lesions, no cervical motion tenderness  Uterus: normal size shape and contour, non-tender Adnexa:no mass, non-tender    Impression:   The encounter diagnosis was Left ovarian cyst.  8x5 cm , asymptomatic , but large enough to cuase torsion .  normal CA125  Plan:    recommend l/s BSO  I have explained the procedure and possible risks She agrees to the procedure Benefits and risks to surgery: The proposed benefit of the surgery has been discussed withthe patient. The possible risks include, butarenot limited BW:GYKZL injury to the bowel , bladder, ureters, and major blood vessels and nerves. There is a possibility of additional surgeries resulting from these injuries. There is also therisk of blood transfusion and the need to receive blood products during or after the procedure which may rarely lead to HIV or Hepatitis C infection. There is a risk of developing a deep venous thrombosis or a pulmonary embolism . There is the possibility of wound infection and also anesthetic complications, even the rare possibility of death. The patient understands these risks and wishes to proceed. All questions have been answered     Caroline Sauger, MD

## 2020-04-02 ENCOUNTER — Other Ambulatory Visit: Payer: Self-pay

## 2020-04-02 ENCOUNTER — Encounter
Admission: RE | Admit: 2020-04-02 | Discharge: 2020-04-02 | Disposition: A | Discharge status: Home / Self Care | Payer: Medicare Other | Source: Ambulatory Visit | Attending: Obstetrics and Gynecology | Admitting: Obstetrics and Gynecology

## 2020-04-02 HISTORY — DX: Hemangioma of intracranial structures: D18.02

## 2020-04-02 NOTE — Patient Instructions (Addendum)
Your procedure is scheduled on: Friday 04/12/20.  Report to THE FIRST FLOOR REGISTRATION DESK IN THE MEDICAL MALL ON THE MORNING OF SURGERY FIRST, THEN YOU WILL CHECK IN AT THE SURGERY INFORMATION DESK LOCATED OUTSIDE THE SAME DAY SURGERY DEPARTMENT LOCATED ON 2ND FLOOR MEDICAL MALL ENTRANCE.  To find out your arrival time please call [phone] between 1PM - 3PM on Thursday 04/11/20.   Remember: Instructions that are not followed completely may result in serious medical risk, up to and including death, or upon the discretion of your surgeon and anesthesiologist your surgery may need to be rescheduled.     __X__ 1. Do not eat food after midnight the night before your procedure.                 No gum chewing or hard candies. You may drink clear liquids up to 2 hours                 before you are scheduled to arrive for your surgery- DO NOT drink clear                 liquids within 2 hours of the start of your surgery.                 Clear Liquids include:  water, apple juice without pulp, clear carbohydrate                 drink such as Clearfast or Gatorade, Black Coffee or Tea (Do not add                 milk or creamer to coffee or tea).   __X__2.  On the morning of surgery brush your teeth with toothpaste and water, you may rinse your mouth with mouthwash if you wish.  Do not swallow any toothpaste or mouthwash.     __X__ 3.  No Alcohol for 24 hours before or after surgery.   __X__ 4.  Do Not Smoke or use e-cigarettes For 24 Hours Prior to Your Surgery.                 Do not use any chewable tobacco products for at least 6 hours prior to                 surgery.  __X__5.  Notify your doctor if there is any change in your medical condition      (cold, fever, infections).      Do NOT wear jewelry, make-up, hairpins, clips or nail polish. Do NOT wear lotions, powders, or perfumes.  Do NOT shave 48 hours prior to surgery. Men may shave face and neck. Do NOT bring valuables  to the hospital.     [facility] is not responsible for any belongings or valuables.   Contacts, dentures/partials or body piercings may not be worn into surgery. Bring a case for your contacts, glasses or hearing aids, a denture cup will be supplied.    Patients discharged the day of surgery will not be allowed to drive home.     __X__ Take these medicines the morning of surgery with A SIP OF WATER:     1. levothyroxine (SYNTHROID)    __X__ Use CHG Soap as directed.  __X__ Stop Anti-inflammatories 7 days before surgery such as Advil, Ibuprofen, Motrin, BC or Goodies Powder, Naprosyn, Naproxen, Aleve, Aspirin, Meloxicam. May take Tylenol if needed for pain or discomfort.   __X__Do not start taking any new herbal  supplements or vitamins prior to your procedure.  __X__ Stop the following herbal supplements or vitamins: Stop all of your herbal supplements or vitamins except:   Probiotic Product (PROBIOTIC DAILY PO)  Multiple Vitamins-Minerals (PRESERVISION AREDS 2 PO)   Wear comfortable clothing (specific to your surgery type) to the hospital.  Plan for stool softeners for home use; pain medications have a tendency to cause constipation. You can also help prevent constipation by eating foods high in fiber such as fruits and vegetables and drinking plenty of fluids as your diet allows.  After surgery, you can prevent lung complications by doing breathing exercises.Take deep breaths and cough every 1-2 hours. Your doctor may order a device called an Incentive Spirometer to help you take deep breaths.  Please call the [place] Department at [phone] if you have any questions about these instructions.

## 2020-04-10 ENCOUNTER — Other Ambulatory Visit: Payer: Self-pay

## 2020-04-10 ENCOUNTER — Other Ambulatory Visit
Admission: RE | Admit: 2020-04-10 | Discharge: 2020-04-10 | Disposition: A | Discharge status: Home / Self Care | Payer: Medicare Other | Source: Ambulatory Visit | Attending: Obstetrics and Gynecology | Admitting: Obstetrics and Gynecology

## 2020-04-10 DIAGNOSIS — Z01818 Encounter for other preprocedural examination: Secondary | ICD-10-CM | POA: Diagnosis present

## 2020-04-10 DIAGNOSIS — Z20822 Contact with and (suspected) exposure to covid-19: Secondary | ICD-10-CM | POA: Insufficient documentation

## 2020-04-10 LAB — BASIC METABOLIC PANEL
Anion gap: 7 (ref 5–15)
BUN: 16 mg/dL (ref 8–23)
CO2: 27 mmol/L (ref 22–32)
Calcium: 9.2 mg/dL (ref 8.9–10.3)
Chloride: 107 mmol/L (ref 98–111)
Creatinine, Ser: 0.87 mg/dL (ref 0.44–1.00)
GFR, Estimated: >60 mL/min (ref >60)
Glucose, Bld: 121 mg/dL — ABNORMAL HIGH (ref 70–99)
Potassium: 3.8 mmol/L (ref 3.5–5.1)
Sodium: 141 mmol/L (ref 135–145)

## 2020-04-10 LAB — CBC
HCT: 37.5 % (ref 36.0–46.0)
Hemoglobin: 12.6 g/dL (ref 12.0–15.0)
MCH: 28.9 pg (ref 26.0–34.0)
MCHC: 33.6 g/dL (ref 30.0–36.0)
MCV: 86 fL (ref 80.0–100.0)
Platelets: 203 10*3/uL (ref 150–400)
RBC: 4.36 MIL/uL (ref 3.87–5.11)
RDW: 13.5 % (ref 11.5–15.5)
WBC: 6.6 10*3/uL (ref 4.0–10.5)
nRBC: 0 % (ref 0.0–0.2)

## 2020-04-10 LAB — TYPE AND SCREEN
ABO/RH(D): B POS
Antibody Screen: NEGATIVE

## 2020-04-10 LAB — SARS CORONAVIRUS 2 (TAT 6-24 HRS): SARS Coronavirus 2: NEGATIVE

## 2020-04-12 ENCOUNTER — Ambulatory Visit: Payer: Medicare Other | Admitting: Anesthesiology

## 2020-04-12 ENCOUNTER — Encounter: Payer: Self-pay | Admitting: Obstetrics and Gynecology

## 2020-04-12 ENCOUNTER — Other Ambulatory Visit: Payer: Self-pay

## 2020-04-12 ENCOUNTER — Ambulatory Visit
Admission: RE | Admit: 2020-04-12 | Discharge: 2020-04-12 | Disposition: A | Discharge status: Home / Self Care | Payer: Medicare Other | Source: Ambulatory Visit | Attending: Obstetrics and Gynecology | Admitting: Obstetrics and Gynecology

## 2020-04-12 ENCOUNTER — Encounter
Admission: RE | Disposition: A | Discharge status: Home / Self Care | Payer: Self-pay | Source: Ambulatory Visit | Attending: Obstetrics and Gynecology

## 2020-04-12 DIAGNOSIS — Z6837 Body mass index (BMI) 37.0-37.9, adult: Secondary | ICD-10-CM | POA: Diagnosis not present

## 2020-04-12 DIAGNOSIS — N83202 Unspecified ovarian cyst, left side: Secondary | ICD-10-CM | POA: Insufficient documentation

## 2020-04-12 DIAGNOSIS — Z833 Family history of diabetes mellitus: Secondary | ICD-10-CM | POA: Insufficient documentation

## 2020-04-12 DIAGNOSIS — N838 Other noninflammatory disorders of ovary, fallopian tube and broad ligament: Secondary | ICD-10-CM | POA: Insufficient documentation

## 2020-04-12 DIAGNOSIS — M810 Age-related osteoporosis without current pathological fracture: Secondary | ICD-10-CM | POA: Diagnosis not present

## 2020-04-12 DIAGNOSIS — E119 Type 2 diabetes mellitus without complications: Secondary | ICD-10-CM | POA: Insufficient documentation

## 2020-04-12 DIAGNOSIS — Z79899 Other long term (current) drug therapy: Secondary | ICD-10-CM | POA: Insufficient documentation

## 2020-04-12 DIAGNOSIS — Z87891 Personal history of nicotine dependence: Secondary | ICD-10-CM | POA: Insufficient documentation

## 2020-04-12 DIAGNOSIS — E890 Postprocedural hypothyroidism: Secondary | ICD-10-CM | POA: Diagnosis not present

## 2020-04-12 DIAGNOSIS — Z8349 Family history of other endocrine, nutritional and metabolic diseases: Secondary | ICD-10-CM | POA: Diagnosis not present

## 2020-04-12 DIAGNOSIS — Z885 Allergy status to narcotic agent status: Secondary | ICD-10-CM | POA: Insufficient documentation

## 2020-04-12 DIAGNOSIS — Z8249 Family history of ischemic heart disease and other diseases of the circulatory system: Secondary | ICD-10-CM | POA: Diagnosis not present

## 2020-04-12 DIAGNOSIS — E669 Obesity, unspecified: Secondary | ICD-10-CM | POA: Insufficient documentation

## 2020-04-12 DIAGNOSIS — Z888 Allergy status to other drugs, medicaments and biological substances status: Secondary | ICD-10-CM | POA: Diagnosis not present

## 2020-04-12 DIAGNOSIS — E785 Hyperlipidemia, unspecified: Secondary | ICD-10-CM | POA: Insufficient documentation

## 2020-04-12 DIAGNOSIS — Z822 Family history of deafness and hearing loss: Secondary | ICD-10-CM | POA: Diagnosis not present

## 2020-04-12 DIAGNOSIS — Z809 Family history of malignant neoplasm, unspecified: Secondary | ICD-10-CM | POA: Insufficient documentation

## 2020-04-12 DIAGNOSIS — Z79890 Hormone replacement therapy (postmenopausal): Secondary | ICD-10-CM | POA: Insufficient documentation

## 2020-04-12 DIAGNOSIS — Z85850 Personal history of malignant neoplasm of thyroid: Secondary | ICD-10-CM | POA: Diagnosis not present

## 2020-04-12 HISTORY — PX: LAPAROSCOPIC BILATERAL SALPINGO OOPHERECTOMY: SHX5890

## 2020-04-12 SURGERY — SALPINGO-OOPHORECTOMY, BILATERAL, LAPAROSCOPIC
Anesthesia: General | Laterality: Bilateral

## 2020-04-12 MED ORDER — FENTANYL CITRATE (PF) 100 MCG/2ML IJ SOLN
INTRAMUSCULAR | Status: DC | PRN
  Administered 2020-04-12 (×3): 50 ug via INTRAVENOUS

## 2020-04-12 MED ORDER — PROPOFOL 10 MG/ML IV BOLUS
INTRAVENOUS | Status: AC
  Filled 2020-04-12: qty 20

## 2020-04-12 MED ORDER — FAMOTIDINE 20 MG PO TABS
ORAL_TABLET | ORAL | Status: AC
  Administered 2020-04-12: 20 mg via ORAL
  Filled 2020-04-12: qty 1

## 2020-04-12 MED ORDER — ONDANSETRON HCL 4 MG PO TABS
8.0000 mg | ORAL_TABLET | Freq: Two times a day (BID) | ORAL | Status: DC
Start: 2020-04-12 — End: 2020-04-12

## 2020-04-12 MED ORDER — OXYCODONE HCL 5 MG PO TABS
5.0000 mg | ORAL_TABLET | Freq: Once | ORAL | Status: AC | PRN
  Administered 2020-04-12: 5 mg via ORAL

## 2020-04-12 MED ORDER — EPHEDRINE SULFATE 50 MG/ML IJ SOLN
INTRAMUSCULAR | Status: DC | PRN
  Administered 2020-04-12: 5 mg via INTRAVENOUS

## 2020-04-12 MED ORDER — FENTANYL CITRATE (PF) 100 MCG/2ML IJ SOLN
INTRAMUSCULAR | Status: AC
  Filled 2020-04-12: qty 2

## 2020-04-12 MED ORDER — ACETAMINOPHEN 500 MG PO TABS: 1000.0000 mg | ORAL_TABLET | ORAL | Status: AC

## 2020-04-12 MED ORDER — GABAPENTIN 300 MG PO CAPS: 300.0000 mg | ORAL_CAPSULE | ORAL | Status: AC

## 2020-04-12 MED ORDER — FENTANYL CITRATE (PF) 100 MCG/2ML IJ SOLN: 25.0000 ug | INTRAMUSCULAR | Status: DC | PRN

## 2020-04-12 MED ORDER — OXYCODONE HCL 5 MG PO TABS
ORAL_TABLET | ORAL | Status: AC
  Filled 2020-04-12: qty 1

## 2020-04-12 MED ORDER — BUPIVACAINE HCL 0.5 % IJ SOLN
INTRAMUSCULAR | Status: DC | PRN
  Administered 2020-04-12: 11 mL

## 2020-04-12 MED ORDER — PROPOFOL 10 MG/ML IV BOLUS
INTRAVENOUS | Status: DC | PRN
  Administered 2020-04-12: 150 mg via INTRAVENOUS

## 2020-04-12 MED ORDER — DEXMEDETOMIDINE (PRECEDEX) IN NS 20 MCG/5ML (4 MCG/ML) IV SYRINGE
PREFILLED_SYRINGE | INTRAVENOUS | Status: AC
  Filled 2020-04-12: qty 5

## 2020-04-12 MED ORDER — POVIDONE-IODINE 10 % EX SWAB
2.0000 "application " | Freq: Once | CUTANEOUS | Status: AC
  Administered 2020-04-12: 2 via TOPICAL

## 2020-04-12 MED ORDER — ACETAMINOPHEN 500 MG PO TABS
ORAL_TABLET | ORAL | Status: AC
  Administered 2020-04-12: 1000 mg via ORAL
  Filled 2020-04-12: qty 2

## 2020-04-12 MED ORDER — PHENYLEPHRINE HCL (PRESSORS) 10 MG/ML IV SOLN
INTRAVENOUS | Status: DC | PRN
  Administered 2020-04-12: 50 ug via INTRAVENOUS
  Administered 2020-04-12 (×2): 100 ug via INTRAVENOUS

## 2020-04-12 MED ORDER — GABAPENTIN 300 MG PO CAPS
ORAL_CAPSULE | ORAL | Status: AC
  Administered 2020-04-12: 300 mg via ORAL
  Filled 2020-04-12: qty 1

## 2020-04-12 MED ORDER — CHLORHEXIDINE GLUCONATE 0.12 % MT SOLN: 15.0000 mL | Freq: Once | OROMUCOSAL | Status: AC

## 2020-04-12 MED ORDER — CHLORHEXIDINE GLUCONATE 0.12 % MT SOLN
OROMUCOSAL | Status: AC
  Administered 2020-04-12: 15 mL via OROMUCOSAL
  Filled 2020-04-12: qty 15

## 2020-04-12 MED ORDER — LIDOCAINE HCL (CARDIAC) PF 100 MG/5ML IV SOSY
PREFILLED_SYRINGE | INTRAVENOUS | Status: DC | PRN
  Administered 2020-04-12: 100 mg via INTRAVENOUS

## 2020-04-12 MED ORDER — ONDANSETRON HCL 4 MG/2ML IJ SOLN
INTRAMUSCULAR | Status: DC | PRN
  Administered 2020-04-12: 4 mg via INTRAVENOUS

## 2020-04-12 MED ORDER — DEXMEDETOMIDINE (PRECEDEX) IN NS 20 MCG/5ML (4 MCG/ML) IV SYRINGE
PREFILLED_SYRINGE | INTRAVENOUS | Status: DC | PRN
  Administered 2020-04-12 (×3): 4 ug via INTRAVENOUS

## 2020-04-12 MED ORDER — LACTATED RINGERS IV SOLN: INTRAVENOUS | Status: DC

## 2020-04-12 MED ORDER — EPHEDRINE 5 MG/ML INJ
INTRAVENOUS | Status: AC
  Filled 2020-04-12: qty 10

## 2020-04-12 MED ORDER — ROCURONIUM BROMIDE 100 MG/10ML IV SOLN
INTRAVENOUS | Status: DC | PRN
  Administered 2020-04-12: 50 mg via INTRAVENOUS

## 2020-04-12 MED ORDER — FAMOTIDINE 20 MG PO TABS: 20.0000 mg | ORAL_TABLET | Freq: Once | ORAL | Status: AC

## 2020-04-12 MED ORDER — OXYCODONE HCL 5 MG/5ML PO SOLN
5.0000 mg | Freq: Once | ORAL | Status: AC | PRN
Start: 2020-04-12 — End: 2020-04-12

## 2020-04-12 MED ORDER — ORAL CARE MOUTH RINSE: 15.0000 mL | Freq: Once | OROMUCOSAL | Status: AC

## 2020-04-12 MED ORDER — SEVOFLURANE IN SOLN
RESPIRATORY_TRACT | Status: AC
  Filled 2020-04-12: qty 250

## 2020-04-12 MED ORDER — DEXAMETHASONE SODIUM PHOSPHATE 10 MG/ML IJ SOLN
INTRAMUSCULAR | Status: DC | PRN
  Administered 2020-04-12: 10 mg via INTRAVENOUS

## 2020-04-12 MED ORDER — MORPHINE SULFATE 15 MG PO TABS: 15.0000 mg | ORAL_TABLET | Freq: Once | ORAL | Status: DC

## 2020-04-12 SURGICAL SUPPLY — 44 items
APL PRP STRL LF DISP 70% ISPRP (MISCELLANEOUS) ×1
APL SRG 38 LTWT LNG FL B (MISCELLANEOUS)
APPLICATOR ARISTA FLEXITIP XL (MISCELLANEOUS) ×1 IMPLANT
BAG DRN RND TRDRP ANRFLXCHMBR (UROLOGICAL SUPPLIES) ×1
BAG SPEC RTRVL LRG 6X4 10 (ENDOMECHANICALS) ×1
BAG URINE DRAIN 2000ML AR STRL (UROLOGICAL SUPPLIES) ×2 IMPLANT
BLADE SURG SZ11 CARB STEEL (BLADE) ×2 IMPLANT
CATH ROBINSON RED A/P 16FR (CATHETERS) ×2 IMPLANT
CHLORAPREP W/TINT 26 (MISCELLANEOUS) ×2 IMPLANT
COVER WAND RF STERILE (DRAPES) ×2 IMPLANT
DRSG TEGADERM 2-3/8X2-3/4 SM (GAUZE/BANDAGES/DRESSINGS) ×4 IMPLANT
GLOVE SURG SYN 8.0 (GLOVE) ×10 IMPLANT
GLOVE SURG SYN 8.0 PF PI (GLOVE) ×1 IMPLANT
GOWN STRL REUS W/ TWL LRG LVL3 (GOWN DISPOSABLE) ×2 IMPLANT
GOWN STRL REUS W/ TWL XL LVL3 (GOWN DISPOSABLE) ×1 IMPLANT
GOWN STRL REUS W/TWL LRG LVL3 (GOWN DISPOSABLE) ×6
GOWN STRL REUS W/TWL XL LVL3 (GOWN DISPOSABLE) ×4
GRASPER SUT TROCAR 14GX15 (MISCELLANEOUS) ×2 IMPLANT
HEMOSTAT ARISTA ABSORB 3G PWDR (HEMOSTASIS) IMPLANT
IRRIGATION STRYKERFLOW (MISCELLANEOUS) ×1 IMPLANT
IRRIGATOR STRYKERFLOW (MISCELLANEOUS) ×2
IV NS 1000ML (IV SOLUTION) ×2
IV NS 1000ML BAXH (IV SOLUTION) ×1 IMPLANT
KIT TURNOVER CYSTO (KITS) ×2 IMPLANT
LABEL OR SOLS (LABEL) ×2 IMPLANT
MANIFOLD NEPTUNE II (INSTRUMENTS) ×2 IMPLANT
NS IRRIG 500ML POUR BTL (IV SOLUTION) ×2 IMPLANT
PACK GYN LAPAROSCOPIC (MISCELLANEOUS) ×2 IMPLANT
PAD OB MATERNITY 4.3X12.25 (PERSONAL CARE ITEMS) ×2 IMPLANT
PAD PREP 24X41 OB/GYN DISP (PERSONAL CARE ITEMS) ×2 IMPLANT
POUCH SPECIMEN RETRIEVAL 10MM (ENDOMECHANICALS) ×2 IMPLANT
SET TUBE SMOKE EVAC HIGH FLOW (TUBING) ×2 IMPLANT
SHEARS HARMONIC ACE PLUS 36CM (ENDOMECHANICALS) ×2 IMPLANT
SLEEVE ENDOPATH XCEL 5M (ENDOMECHANICALS) ×2 IMPLANT
SPONGE GAUZE 2X2 8PLY STRL LF (GAUZE/BANDAGES/DRESSINGS) ×2 IMPLANT
STRIP CLOSURE SKIN 1/4X4 (GAUZE/BANDAGES/DRESSINGS) ×2 IMPLANT
SUT VIC AB 0 CT1 36 (SUTURE) ×2 IMPLANT
SUT VIC AB 2-0 UR6 27 (SUTURE) ×2 IMPLANT
SUT VIC AB 4-0 SH 27 (SUTURE) ×2
SUT VIC AB 4-0 SH 27XANBCTRL (SUTURE) ×1 IMPLANT
SWABSTK COMLB BENZOIN TINCTURE (MISCELLANEOUS) ×2 IMPLANT
SYR 50ML LL SCALE MARK (SYRINGE) ×1 IMPLANT
TROCAR ENDO BLADELESS 11MM (ENDOMECHANICALS) ×2 IMPLANT
TROCAR XCEL NON-BLD 5MMX100MML (ENDOMECHANICALS) ×2 IMPLANT

## 2020-04-12 NOTE — Brief Op Note (Signed)
04/12/2020  9:12 AM  PATIENT:  Sherry Paul  75 y.o. female  PRE-OPERATIVE DIAGNOSIS:  left ovarian cyst 8cm  POST-OPERATIVE DIAGNOSIS:  left ovarian cyst 8cm  PROCEDURE:  Procedure(s): LAPAROSCOPIC BILATERAL SALPINGO OOPHORECTOMY (Bilateral)  SURGEON:  Surgeon(s) and Role:    * Elli Groesbeck, Gwen Her, MD - Primary    * Benjaman Kindler, MD - Assisting  PHYSICIAN ASSISTANT: PA student Shive  ASSISTANTS: none   ANESTHESIA:   general  EBL:  5 mL   BLOOD ADMINISTERED:none  DRAINS: none   LOCAL MEDICATIONS USED:  MARCAINE     SPECIMEN:  Source of Specimen:  right and left tube and ovary  DISPOSITION OF SPECIMEN:  PATHOLOGY  COUNTS:  YES  TOURNIQUET:  * No tourniquets in log *  DICTATION: .Other Dictation: Dictation Number verbal  PLAN OF CARE: Discharge to home after PACU  PATIENT DISPOSITION:  PACU - hemodynamically stable.   Delay start of Pharmacological VTE Paul (>24hrs) due to surgical blood loss or risk of bleeding: not applicable

## 2020-04-12 NOTE — Op Note (Signed)
Sherry Paul, Sherry Paul MEDICAL RECORD NO: [id number] ACCOUNT NO: [id number] DATE OF BIRTH: 06/10/1945 FACILITY: [facility] LOCATION: [facility] PHYSICIAN: Boykin Nearing, MD  Operative Report   DATE OF PROCEDURE: 04/12/2020  PREOPERATIVE DIAGNOSIS:  Complex left ovarian cyst.  POSTOPERATIVE DIAGNOSIS:  Complex left ovarian cyst.  PROCEDURES:  Laparoscopic bilateral salpingo-oophorectomy.  ANESTHESIA:  General endotracheal anesthesia.  SURGEON:  Boykin Nearing, MD  FIRST ASSISTANTLeafy Ro, MD  SECOND ASSISTANT:  PA student, Shive.  INDICATIONS:  A 75 year old female who was found to have an inadvertent 8 x 6 cm complex left ovarian cyst.  The patient is asymptomatic.  The patient is at risk for ovarian torsion.  Normal CA-125.  FINDINGS:  Multiloculated left ovarian cyst, clear fluid upon draining.  DESCRIPTION OF PROCEDURE:  After adequate general endotracheal anesthesia, the patient was placed in dorsal supine position with legs placed in the [place].  The patient was prepped and draped in sterile fashion.  Timeout was performed.  Straight  catheterization of the bladder yielded 50 mL of clear urine.  A sponge stick was placed in the vagina to be used for uterine manipulation during the procedure.  Gloves and gown were changed.  Attention was directed to the patient's abdomen where a 5 mm  infraumbilical incision was made after injection with 0.5% Marcaine.  The 5 mm laparoscope was advanced into the abdominal cavity under direct visualization.  The patient's abdomen was insufflated.  A second port placement was placed at the left lower  quadrant 3 cm medial to the left anterior iliac spine.  #11 trocar was advanced under direct visualization.  Third port placement was placed at the right lower quadrant 3 cm medial to the right anterior iliac spine.  A large blue domed ovary was noted on  the left.  This was grasped with the grasper from the contralateral side and  the infundibulopelvic ligament was cauterized with Kleppinger cautery and transected with the Harmonic scalpel.  Good hemostasis was noted.  Similar procedure was repeated on  the patient's right side.  Again, the right infundibulopelvic ligament was cauterized and transected with the Harmonic scalpel.  Some small oozing of the pedicle responded well to the Kleppinger cautery.  Both fallopian tubes and ovaries were placed into  an EndoCatch bag that was brought out through the left lower port site.  The left ovarian cyst was drained and suctioned to decompress the ovary so that the contents could stay within the bag and both ovaries and tubes could be removed through the left  port site.  The patient's intra-abdominal pressure was lowered to 7 mmHg with good hemostasis noted, and the patient's abdomen was then deflated.  The left lower port site was closed with a fascial closure of 2-0 Vicryl using the Carter-Thomason cone.   Good approximation of the fascial tissues.  The patient's abdomen was then deflated, and all skin incisions were closed with interrupted 4-0 Vicryl sutures.  Sponge stick was removed.  There were no complications.  Estimated blood loss 5 mL.  The patient  was taken to recovery room in good condition.   [facility] D: 04/12/2020 9:56:29 am T: 04/12/2020 1:11:00 pm  JOB: [id number]/ [id number]

## 2020-04-12 NOTE — Progress Notes (Signed)
Patient complained of being dizzy. VSS. Patient steady on her feet. Spoke with anesthesia and Dr Ouida Sills. Ok to discharge due to VSS.

## 2020-04-12 NOTE — Anesthesia Preprocedure Evaluation (Signed)
Anesthesia Evaluation  Patient identified by MRN, date of birth, ID band Patient awake    Reviewed: Allergy & Precautions, H&P , NPO status , Patient's Chart, lab work & pertinent test results  History of Anesthesia Complications Negative for: history of anesthetic complications  Airway Mallampati: III  TM Distance: <3 FB Neck ROM: full    Dental  (+) Chipped, Poor Dentition, Missing   Pulmonary neg shortness of breath, asthma , former smoker,    Pulmonary exam normal        Cardiovascular Normal cardiovascular exam     Neuro/Psych  Neuromuscular disease (right side symptom) negative psych ROS   GI/Hepatic negative GI ROS, Neg liver ROS,   Endo/Other  negative endocrine ROS  Renal/GU      Musculoskeletal   Abdominal   Peds  Hematology negative hematology ROS (+)   Anesthesia Other Findings Past Medical History: No date: Cancer ([place])     Comment:  thyroid ca No date: 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[place]) No date: Onychomycosis No date: Osteoporosis  Past Surgical History: No date: CESAREAN SECTION 10/04/2017: COLONOSCOPY WITH PROPOFOL; N/A     Comment:  Procedure: COLONOSCOPY WITH PROPOFOL;  Surgeon: Manya Silvas, MD;  Location: [facility] ENDOSCOPY;  Service:               Endoscopy;  Laterality: N/A; 06/25/2014: ESOPHAGOGASTRODUODENOSCOPY; N/A     Comment:  Procedure: ESOPHAGOGASTRODUODENOSCOPY (EGD);  Surgeon:               Manya Silvas, MD;  Location: [facility] ENDOSCOPY;                Service: Endoscopy;  Laterality: N/A; No date: monitoring cranial nerves bilateral 06/25/2014: SAVORY DILATION; N/A     Comment:  Procedure: SAVORY DILATION;  Surgeon: Manya Silvas,               MD;  Location: [facility] ENDOSCOPY;  Service: Endoscopy;                Laterality: N/A; No date: TONSILLECTOMY No date: TOTAL THYROIDECTOMY     Reproductive/Obstetrics negative OB ROS                              Anesthesia Physical Anesthesia Plan  ASA: III  Anesthesia Plan: General ETT   Post-op Pain Management:    Induction: Intravenous  PONV Risk Score and Plan: Ondansetron, Dexamethasone, Midazolam and Treatment may vary due to age or medical condition  Airway Management Planned: Oral ETT  Additional Equipment:   Intra-op Plan:   Post-operative Plan: Extubation in OR  Informed Consent: I have reviewed the patients History and Physical, chart, labs and discussed the procedure including the risks, benefits and alternatives for the proposed anesthesia with the patient or authorized representative who has indicated his/her understanding and acceptance.     Dental Advisory Given  Plan Discussed with: Anesthesiologist, CRNA and Surgeon  Anesthesia Plan Comments: (Patient consented for risks of anesthesia including but not limited to:  - adverse reactions to medications - damage to eyes, teeth, lips or other oral mucosa - nerve damage due to positioning  - sore throat or hoarseness - Damage to heart, brain, nerves, lungs, other parts of body or loss of life  Patient voiced understanding.)        Anesthesia Quick Evaluation

## 2020-04-12 NOTE — Anesthesia Procedure Notes (Signed)
Procedure Name: Intubation Date/Time: 04/12/2020 7:46 AM Performed by: Doreen Salvage, CRNA Pre-anesthesia Checklist: Patient identified, Patient being monitored, Timeout performed, Emergency Drugs available and Suction available Patient Re-evaluated:Patient Re-evaluated prior to induction Oxygen Delivery Method: Circle system utilized Preoxygenation: Pre-oxygenation with 100% oxygen Induction Type: IV induction Ventilation: Mask ventilation without difficulty Laryngoscope Size: Mac, 3 and McGraph Grade View: Grade I Tube type: Oral Tube size: 7.0 mm Number of attempts: 1 Airway Equipment and Method: Stylet Placement Confirmation: ETT inserted through vocal cords under direct vision,  positive ETCO2 and breath sounds checked- equal and bilateral Secured at: 21 (at the lip) cm Tube secured with: Tape Dental Injury: Teeth and Oropharynx as per pre-operative assessment

## 2020-04-12 NOTE — Discharge Instructions (Signed)

## 2020-04-12 NOTE — Transfer of Care (Signed)
Immediate Anesthesia Transfer of Care Note  Patient: Sherry Paul  Procedure(s) Performed: Procedure(s): LAPAROSCOPIC BILATERAL SALPINGO OOPHORECTOMY (Bilateral)  Patient Location: PACU  Anesthesia Type:General  Level of Consciousness: sedated  Airway & Oxygen Therapy: Patient Spontanous Breathing and Patient connected to face mask oxygen  Post-op Assessment: Report given to RN and Post -op Vital signs reviewed and stable  Post vital signs: Reviewed and stable  Last Vitals:  Vitals:   04/12/20 0719  BP: 130/71  Pulse: 81  Resp: 17  Temp: 36.9 C  SpO2: 44%    Complications: No apparent anesthesia complications

## 2020-04-12 NOTE — Progress Notes (Signed)
Ready for L/S BSo for 8 cm left ovarian cyst . NPO . Labs reviewed . All questions answered

## 2020-04-13 LAB — ABO/RH: ABO/RH(D): B POS

## 2020-04-14 NOTE — Anesthesia Postprocedure Evaluation (Signed)
Anesthesia Post Note  Patient: Sherry Paul  Procedure(s) Performed: LAPAROSCOPIC BILATERAL SALPINGO OOPHORECTOMY (Bilateral )  Patient location during evaluation: PACU Anesthesia Type: General Level of consciousness: awake and alert Pain management: pain level controlled Vital Signs Assessment: post-procedure vital signs reviewed and stable Respiratory status: spontaneous breathing, nonlabored ventilation, respiratory function stable and patient connected to nasal cannula oxygen Cardiovascular status: blood pressure returned to baseline and stable Postop Assessment: no apparent nausea or vomiting Anesthetic complications: no   No complications documented.   Last Vitals:  Vitals:   04/12/20 1036 04/12/20 1313  BP: (!) 143/79 120/66  Pulse: (!) 59 77  Resp: 12 12  Temp: 36.4 C   SpO2: 91% 96%    Last Pain:  Vitals:   04/12/20 1313  TempSrc:   PainSc: 4                  Precious Haws [place]

## 2020-04-15 ENCOUNTER — Encounter: Payer: Self-pay | Admitting: Obstetrics and Gynecology

## 2020-04-15 LAB — SURGICAL PATHOLOGY

## 2020-05-27 ENCOUNTER — Other Ambulatory Visit: Payer: Self-pay | Admitting: Nurse Practitioner

## 2020-05-27 DIAGNOSIS — Z1231 Encounter for screening mammogram for malignant neoplasm of breast: Secondary | ICD-10-CM

## 2020-06-19 ENCOUNTER — Ambulatory Visit
Admission: RE | Admit: 2020-06-19 | Discharge: 2020-06-19 | Disposition: A | Discharge status: Home / Self Care | Payer: Medicare Other | Source: Ambulatory Visit | Attending: Nurse Practitioner | Admitting: Nurse Practitioner

## 2020-06-19 ENCOUNTER — Other Ambulatory Visit: Payer: Self-pay

## 2020-06-19 DIAGNOSIS — Z1231 Encounter for screening mammogram for malignant neoplasm of breast: Secondary | ICD-10-CM | POA: Insufficient documentation

## 2020-06-19 IMAGING — MG MM DIGITAL SCREENING BILAT W/ TOMO AND CAD
8 series · 8 of 24 positions shown · non-contrast
Comparison: Previous exam(s).

ACR Breast Density Category a: The breast tissue is almost entirely
fatty.

CLINICAL DATA: Screening.

EXAM:
DIGITAL SCREENING BILATERAL MAMMOGRAM WITH TOMOSYNTHESIS AND CAD
TECHNIQUE: Bilateral screening digital craniocaudal and mediolateral oblique
mammograms were obtained. Bilateral screening digital breast
tomosynthesis was performed. The images were evaluated with
computer-aided detection.

[L MLO synth-2D]
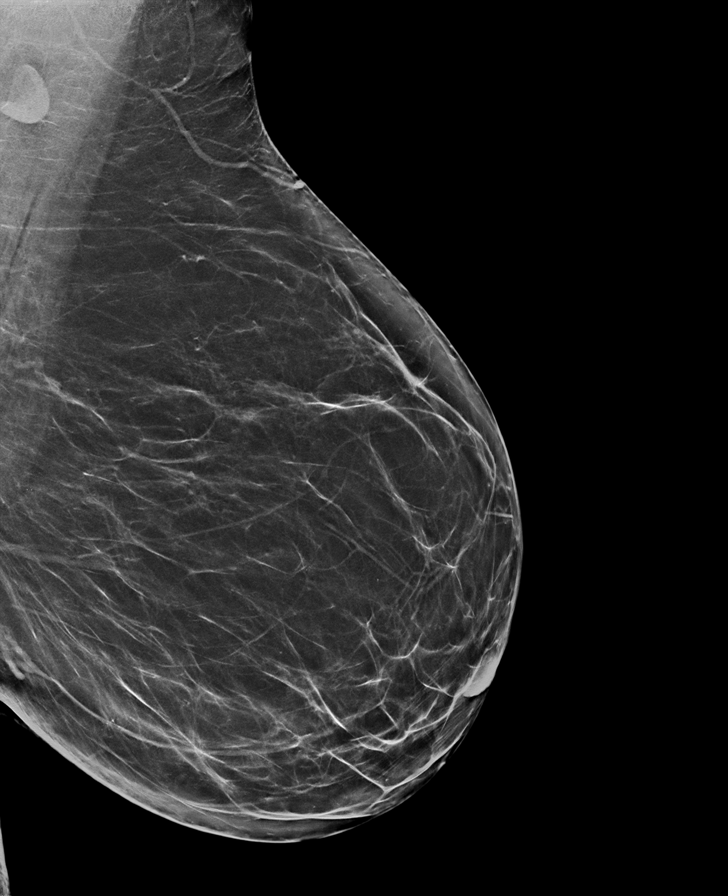

[R CC synth-2D]
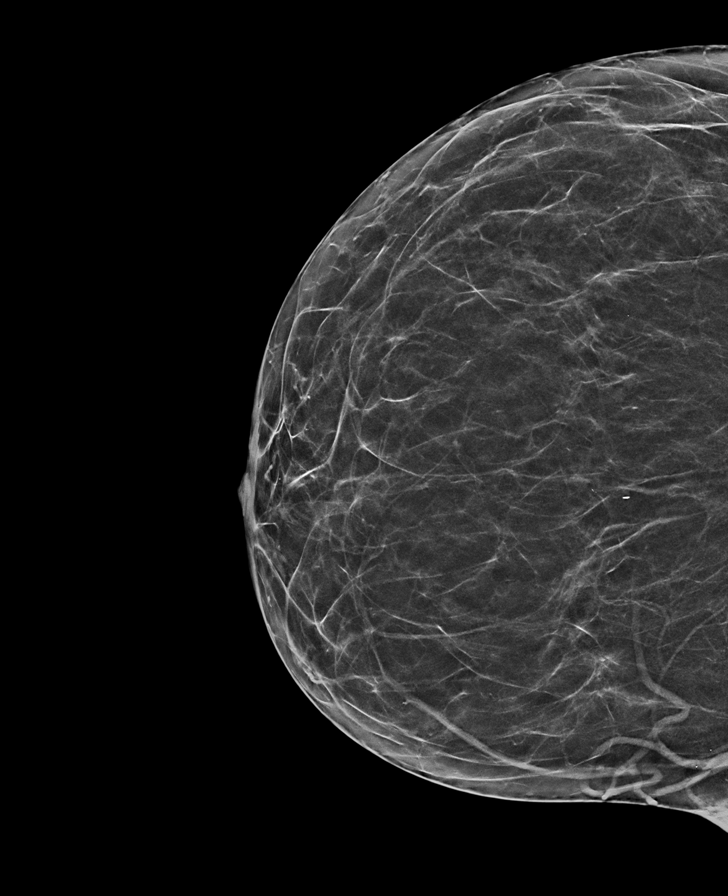

[L CC synth-2D]
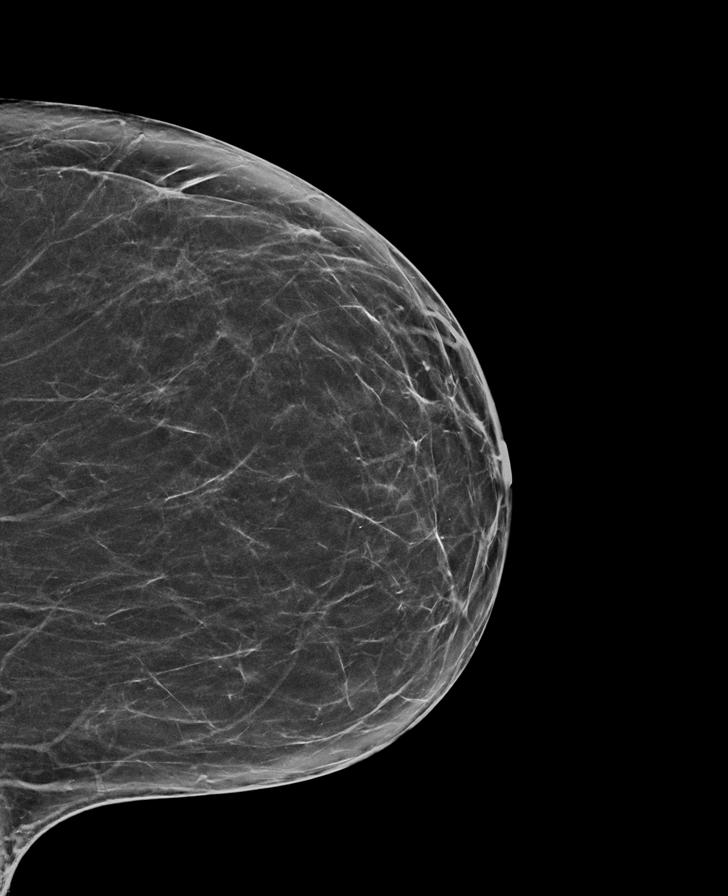

[R MLO synth-2D]
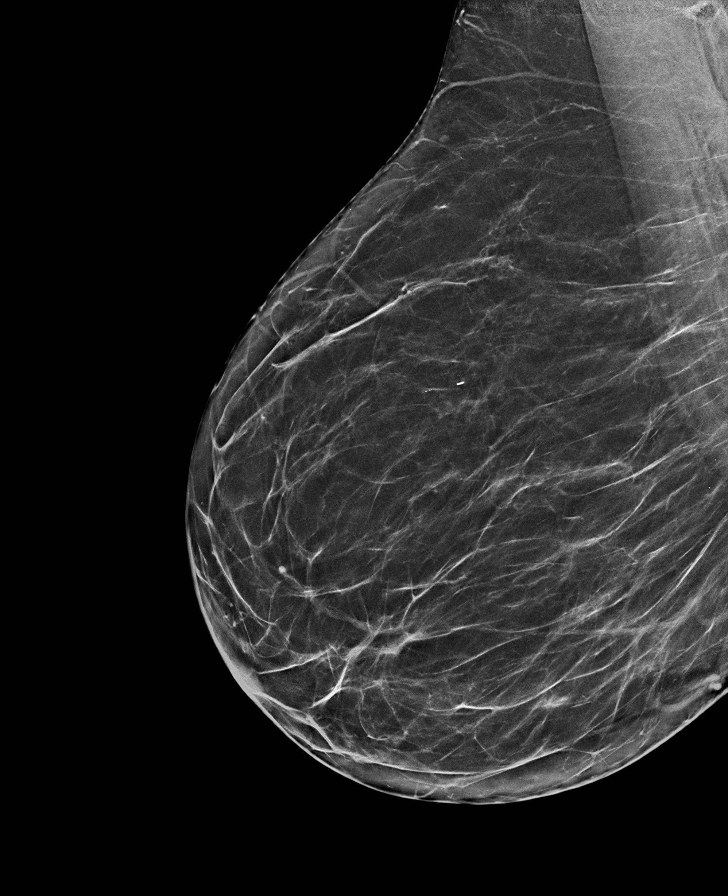

[L CC tomo · tomo slice 33/64.0]
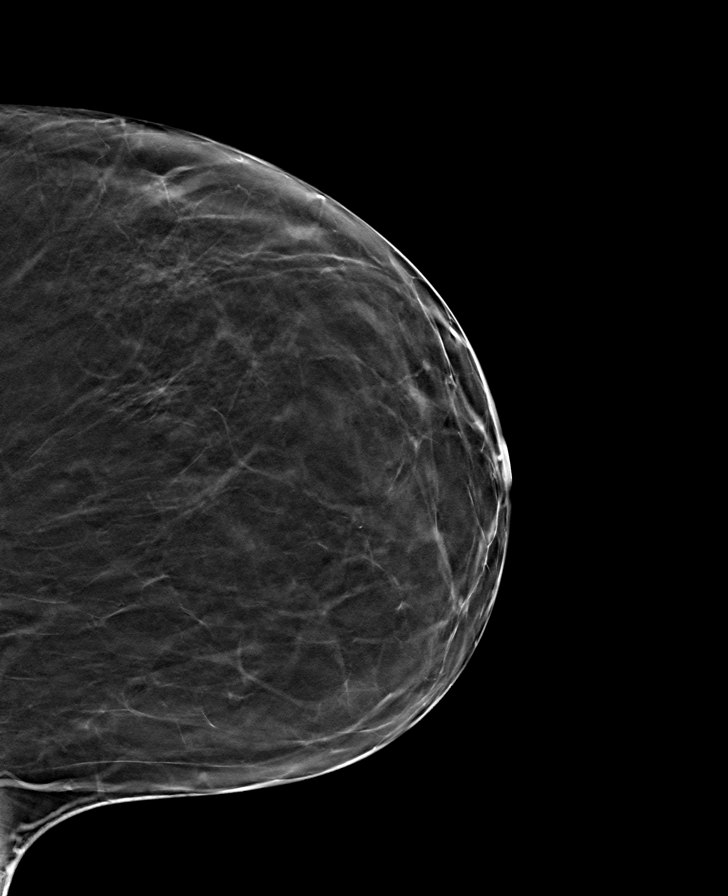

[L MLO tomo · tomo slice 39/76.0]
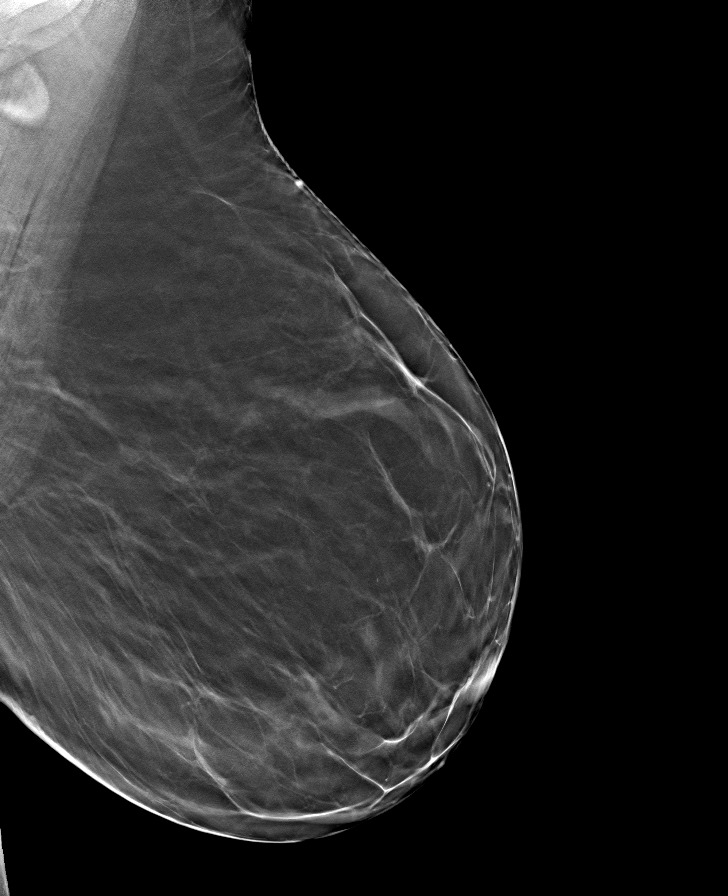

[R CC tomo · tomo slice 31/60.0]
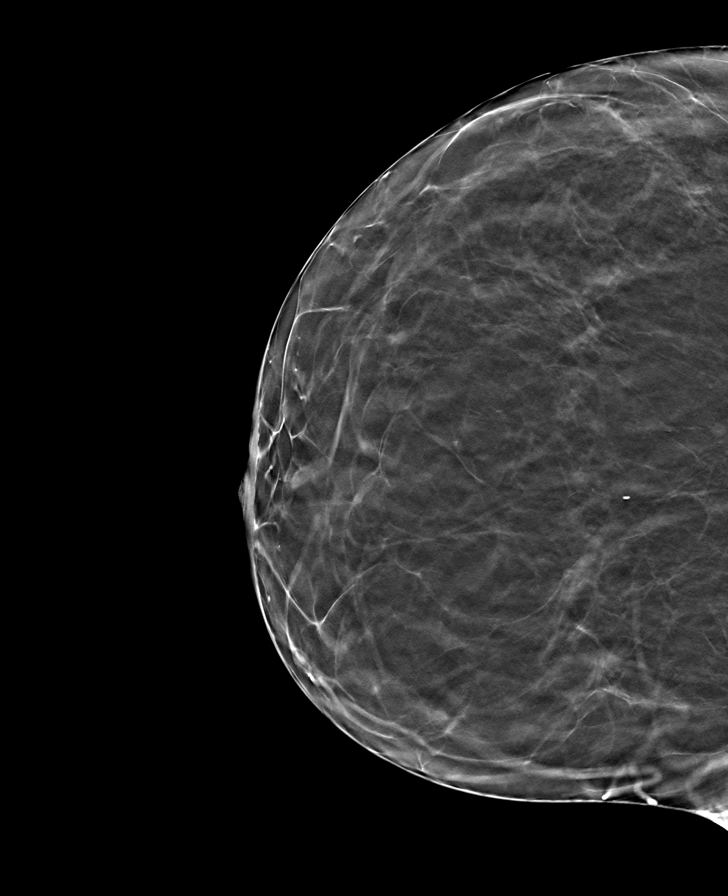

[R MLO tomo · tomo slice 36/71.0]
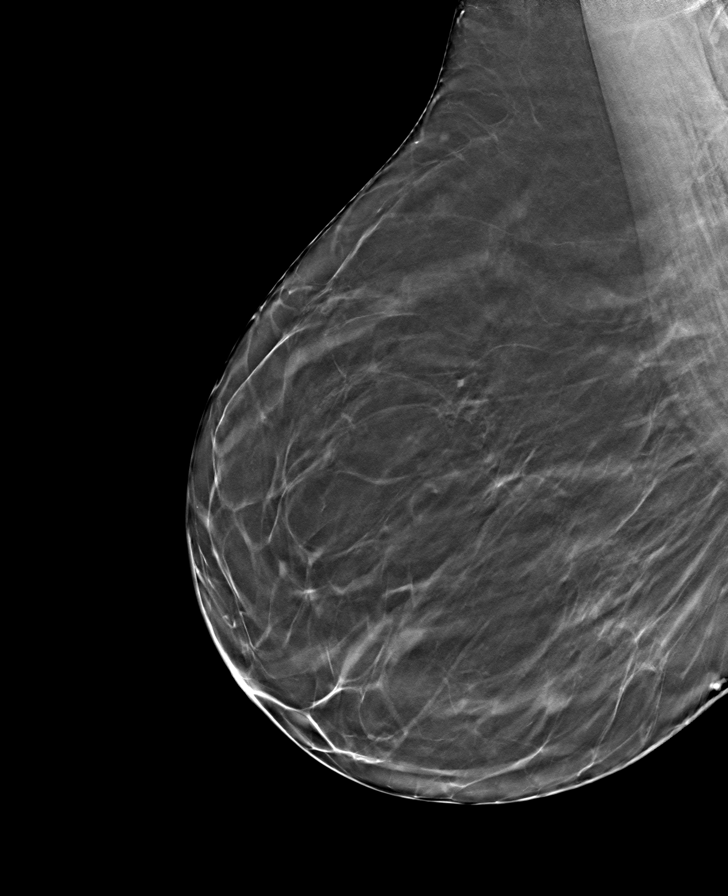

[8 of 24 positions shown; findings below may reference images not displayed]

FINDINGS: There are no findings suspicious for malignancy. The images were
evaluated with computer-aided detection.
IMPRESSION: No mammographic evidence of malignancy. A result letter of this
screening mammogram will be mailed directly to the patient.

RECOMMENDATION:
Screening mammogram in one year. (Code:[8E])

BI-RADS CATEGORY  1: Negative.

## 2021-02-11 ENCOUNTER — Other Ambulatory Visit: Payer: Self-pay | Admitting: Sports Medicine

## 2021-02-11 DIAGNOSIS — M5136 Other intervertebral disc degeneration, lumbar region: Secondary | ICD-10-CM

## 2021-02-11 DIAGNOSIS — M47816 Spondylosis without myelopathy or radiculopathy, lumbar region: Secondary | ICD-10-CM

## 2021-02-11 DIAGNOSIS — G8929 Other chronic pain: Secondary | ICD-10-CM

## 2021-02-20 ENCOUNTER — Ambulatory Visit: Payer: Medicare HMO

## 2021-03-05 ENCOUNTER — Ambulatory Visit
Admission: RE | Admit: 2021-03-05 | Discharge: 2021-03-05 | Disposition: A | Discharge status: Home / Self Care | Payer: Medicare HMO | Source: Ambulatory Visit | Attending: Sports Medicine | Admitting: Sports Medicine

## 2021-03-05 ENCOUNTER — Other Ambulatory Visit: Payer: Self-pay

## 2021-03-05 DIAGNOSIS — M5441 Lumbago with sciatica, right side: Secondary | ICD-10-CM | POA: Diagnosis present

## 2021-03-05 DIAGNOSIS — M5136 Other intervertebral disc degeneration, lumbar region: Secondary | ICD-10-CM | POA: Diagnosis not present

## 2021-03-05 DIAGNOSIS — G8929 Other chronic pain: Secondary | ICD-10-CM | POA: Diagnosis present

## 2021-03-05 DIAGNOSIS — M25551 Pain in right hip: Secondary | ICD-10-CM | POA: Insufficient documentation

## 2021-03-05 DIAGNOSIS — M47816 Spondylosis without myelopathy or radiculopathy, lumbar region: Secondary | ICD-10-CM | POA: Diagnosis present

## 2021-03-05 DIAGNOSIS — M51369 Other intervertebral disc degeneration, lumbar region without mention of lumbar back pain or lower extremity pain: Secondary | ICD-10-CM

## 2021-03-05 IMAGING — MR MR LUMBAR SPINE W/O CM
4 of 5 series · 26 of 48 positions shown · non-contrast
Comparison: Lumbar radiographs [DATE]

CLINICAL DATA: Right arm and hip pain

EXAM:
MRI LUMBAR SPINE WITHOUT CONTRAST
TECHNIQUE: Multiplanar, multisequence MR imaging of the lumbar spine was
performed. No intravenous contrast was administered.

[Series 2: T2 · sagittal · 4.0mm · 0.81mm/px · 6 of 15 slices shown (1 of 2)]
[im 1/15]
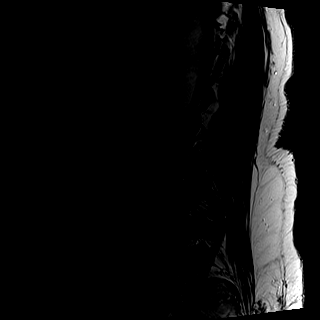
[im 3/15]
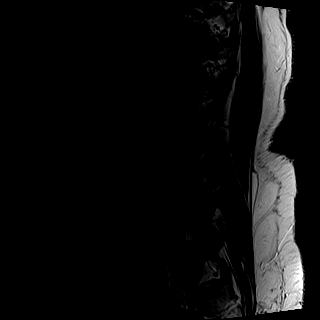
[im 6/15]
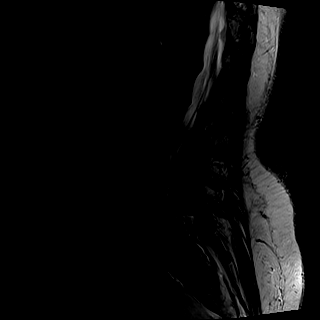
[im 9/15]
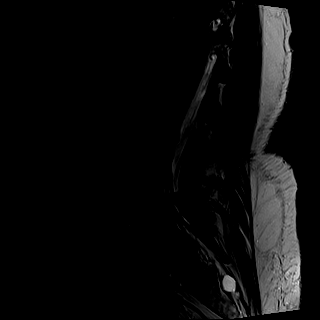
[im 12/15]
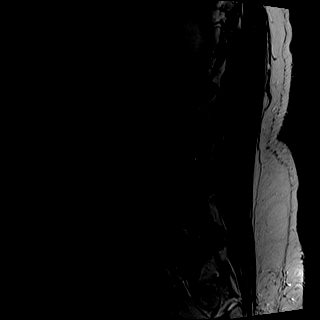
[im 15/15]
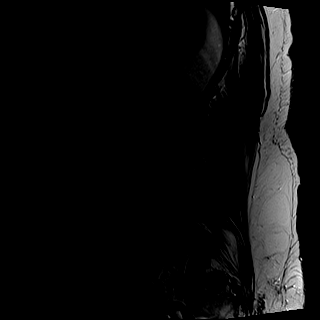

[Series 3: T1 · sagittal · 4.0mm · 0.41mm/px · 6 of 15 slices shown (1 of 2)]
[im 1/15]
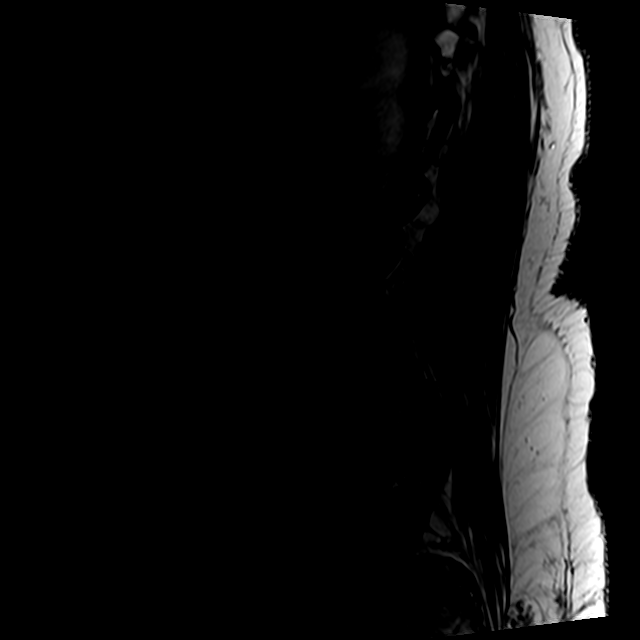
[im 3/15]
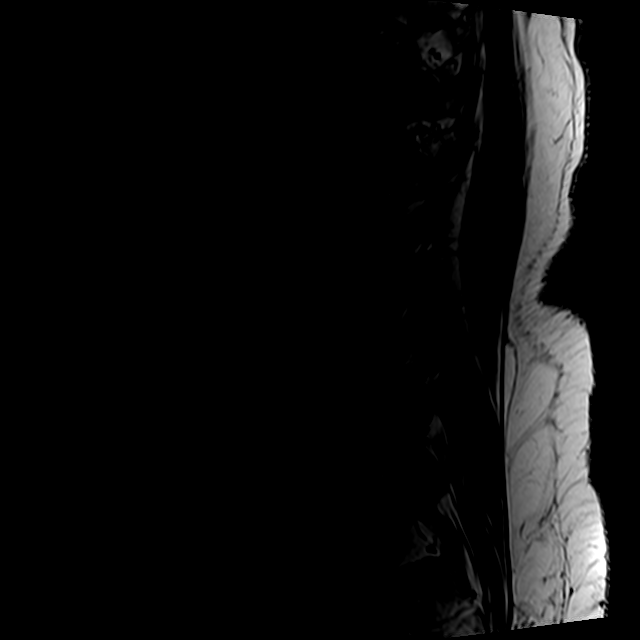
[im 6/15]
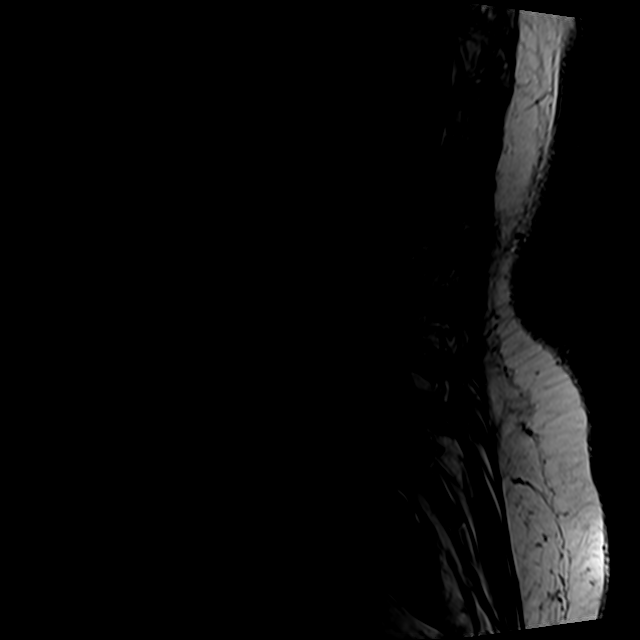
[im 9/15]
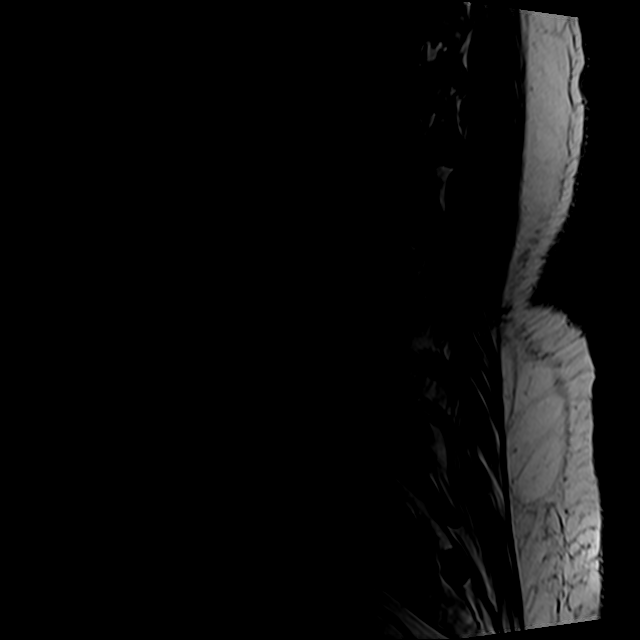
[im 12/15]
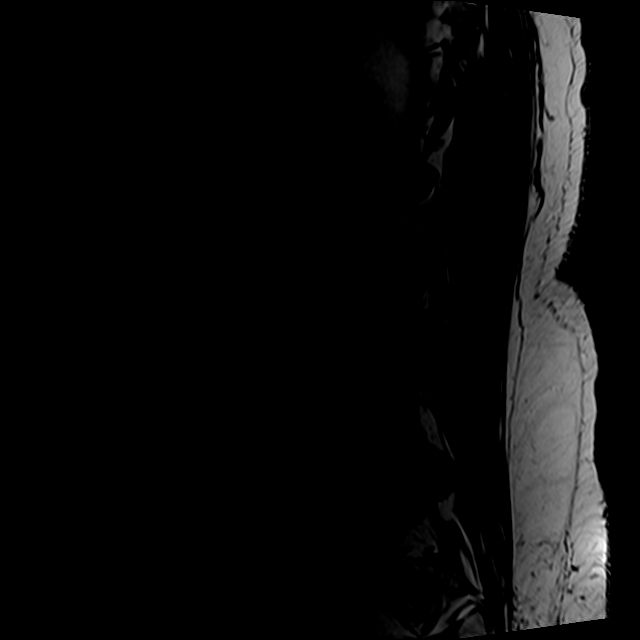
[im 15/15]
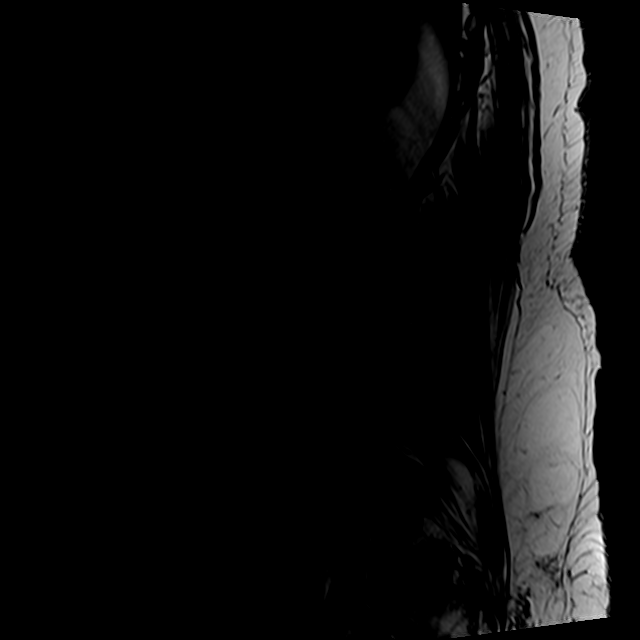

[Series 5: T2 · axial · 4.0mm · 0.78mm/px · z∈[-95,+108]mm · 9 of 37 slices shown (2 of 2)]
[im 1/37]
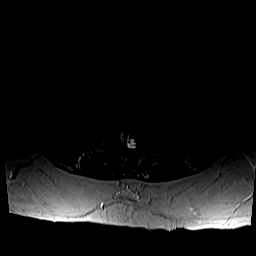
[im 6/37]
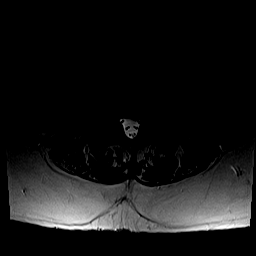
[im 11/37]
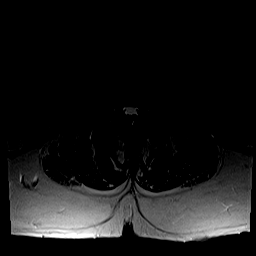
[im 16/37]
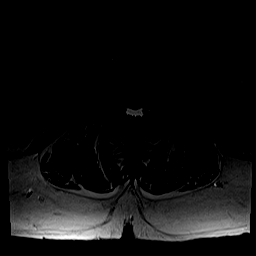
[im 19/37]
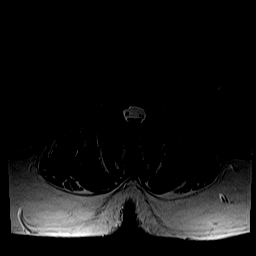
[im 21/37]
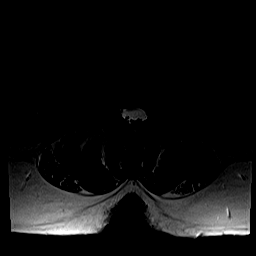
[im 26/37]
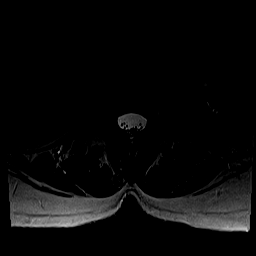
[im 31/37]
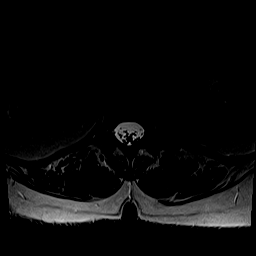
[im 37/37]
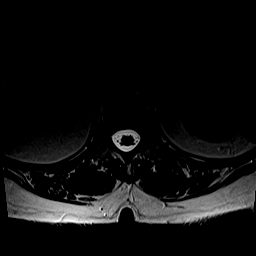

[Series 6: T1 · axial · 4.0mm · 0.39mm/px · z∈[-95,+78]mm · 5 of 37 slices shown (2 of 2)]
[im 1/37]
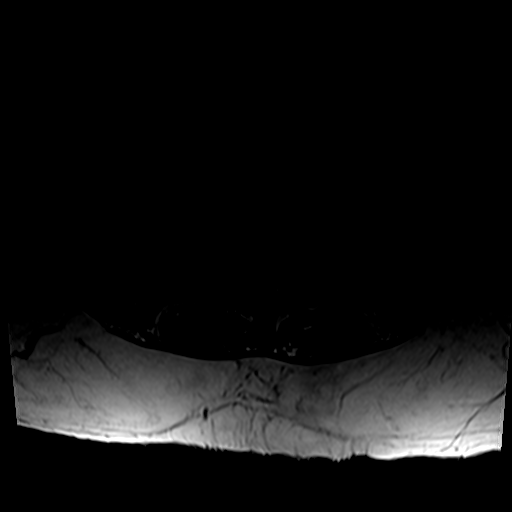
[im 6/37]
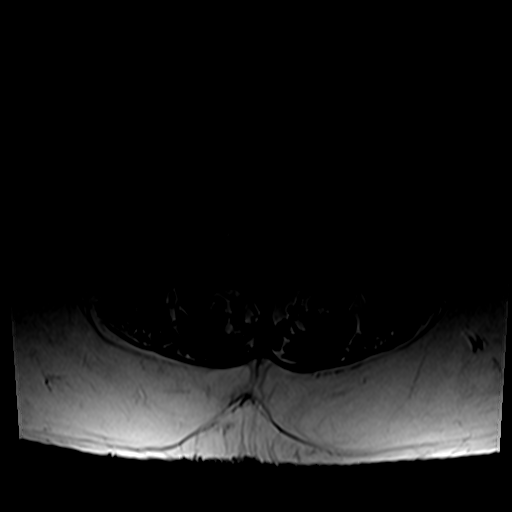
[im 11/37]
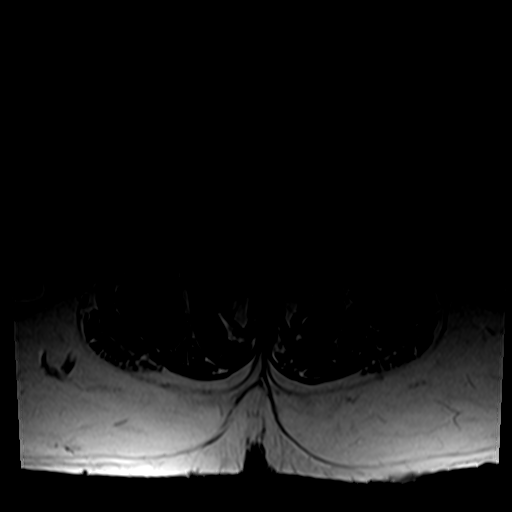
[im 19/37]
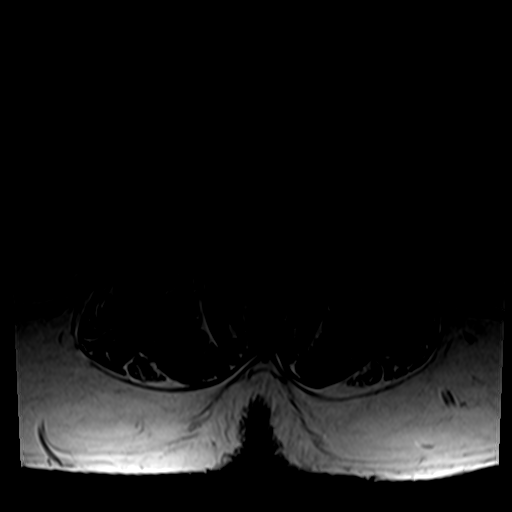
[im 31/37]
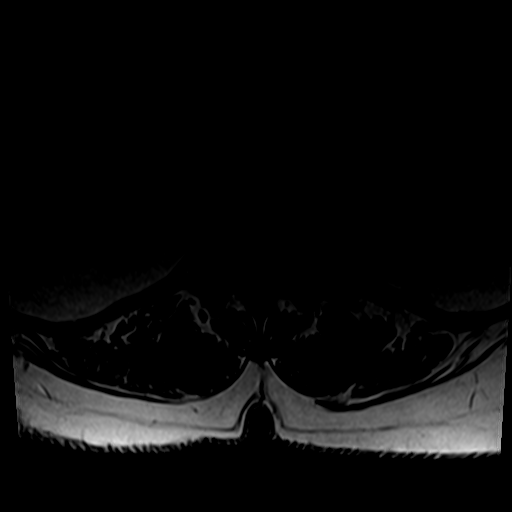

[26 of 48 positions shown; findings below may reference images not displayed]

FINDINGS: Segmentation: L5 is a sacralized vertebra. Hypoplastic disc space at
L5-S1. There are small ribs at T12 noted on the x-ray.

Alignment: Mild retrolisthesis T11-12, T12-L1, L1-2, L2-3. Mild
anterolisthesis L3-4 and L4-5.

Vertebrae:  Negative for fracture or mass

Conus medullaris and cauda equina: Conus extends to the T12-L1
level. Conus and cauda equina appear normal.

Paraspinal and other soft tissues: Negative for paraspinous mass or
adenopathy. Small renal cysts bilaterally. 12 mm Tarlov cyst in the
sacral canal at S1

Disc levels:

T11-12: Small right paracentral disc protrusion. Negative for
stenosis

T12-L1: Mild disc degeneration.  Negative for stenosis

L1-2: Disc degeneration with disc bulging and diffuse endplate
spurring. Mild facet degeneration. Mild subarticular stenosis
bilaterally

L2-3: Disc degeneration with diffuse endplate spurring. Shallow
central disc protrusion. Bilateral facet degeneration. Mild
narrowing of the spinal canal and moderate subarticular stenosis
bilaterally.

L3-4: Moderate facet degeneration bilaterally. Mild narrowing the
spinal canal. Mild subarticular stenosis bilaterally

L4-5: Moderate facet degeneration.  No significant stenosis.

L5-S1: Hypoplastic disc space.  Negative for stenosis.
IMPRESSION: Multilevel disc and facet degeneration throughout the lumbar spine.
Multilevel spondylolisthesis.

Mild subarticular stenosis bilaterally L1-2

Mild spinal stenosis L2-3 with moderate subarticular stenosis
bilaterally.

Mild subarticular stenosis bilaterally L3-4

L5 is sacralized.

## 2021-10-20 ENCOUNTER — Ambulatory Visit: Payer: Medicare HMO | Attending: Nurse Practitioner

## 2021-10-20 DIAGNOSIS — M6281 Muscle weakness (generalized): Secondary | ICD-10-CM | POA: Diagnosis present

## 2021-10-20 DIAGNOSIS — M5459 Other low back pain: Secondary | ICD-10-CM | POA: Insufficient documentation

## 2021-10-20 NOTE — Therapy (Unsigned)
OUTPATIENT PHYSICAL THERAPY THORACOLUMBAR EVALUATION   Patient Name: Sherry Paul MRN: [id number] DOB:05-17-1945, 76 y.o., female Today's Date: 10/22/2021   PT End of Session - 10/21/21 2211     Visit Number 1    Number of Visits 17    Date for PT Re-Evaluation 12/15/21    Authorization Type eval: 10/20/21    PT Start Time 0845    PT Stop Time 0930    PT Time Calculation (min) 45 min    Activity Tolerance Patient tolerated treatment well    Behavior During Therapy [facility] for tasks assessed/performed            Past Medical History:  Diagnosis Date   Cancer ([place])    thyroid ca   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[place])    Onychomycosis    Osteoporosis    Past Surgical History:  Procedure Laterality Date   CESAREAN SECTION     COLONOSCOPY WITH PROPOFOL N/A 10/04/2017   Procedure: COLONOSCOPY WITH PROPOFOL;  Surgeon: Manya Silvas, MD;  Location: [facility] ENDOSCOPY;  Service: Endoscopy;  Laterality: N/A;   ESOPHAGOGASTRODUODENOSCOPY N/A 06/25/2014   Procedure: ESOPHAGOGASTRODUODENOSCOPY (EGD);  Surgeon: Manya Silvas, MD;  Location: [facility] ENDOSCOPY;  Service: Endoscopy;  Laterality: N/A;   LAPAROSCOPIC BILATERAL SALPINGO OOPHERECTOMY Bilateral 04/12/2020   Procedure: LAPAROSCOPIC BILATERAL SALPINGO OOPHORECTOMY;  Surgeon: Schermerhorn, Gwen Her, MD;  Location: [facility] ORS;  Service: Gynecology;  Laterality: Bilateral;   monitoring cranial nerves bilateral     OOPHORECTOMY Bilateral    SAVORY DILATION N/A 06/25/2014   Procedure: SAVORY DILATION;  Surgeon: Manya Silvas, MD;  Location: [facility] ENDOSCOPY;  Service: Endoscopy;  Laterality: N/A;   TONSILLECTOMY     TOTAL THYROIDECTOMY     There are no problems to display for this patient.   PCP: Sallee Lange, NP  REFERRING PROVIDER: Sallee Lange, *  REFERRING DIAGNOSIS: M54.50 (ICD-10-CM) - Low back pain, unspecified, R29.898 (ICD-10-CM) - Other symptoms and signs involving the musculoskeletal system, M62.830  (ICD-10-CM) - Muscle spasm of back  THERAPY DIAG: Other low back pain  Muscle weakness (generalized)  RATIONALE FOR EVALUATION AND TREATMENT: Rehabilitation  ONSET DATE: 09/17/21 (approximate)  FOLLOW UP APPT WITH PROVIDER: No    SUBJECTIVE:                                                                                                                                                                                         Chief Complaint: R low back pain  Pertinent History Presents referred to physical therapy for acute lower back pain which started around 09/17/21 upon waking. No known trauma or precipitating event. Pain was  localized initially in the back but eventually started radiating down the RLE to the level of the knee. She was prescribed a steroid taper which helped considerably. "My back is feeling pretty good today." She reports approximately 85% improvement in her back symptoms since onset but continues with symptoms in the R low back. Pt has a history of L sided hemangioma that is "the size of my brainstem" and affects the strength in the right side of her body (BUE/BLE). She has been advised to wait for any surgical intervention until it causes severe mobility impairments because of the risks involved with the procedure. No prior history of prior significant back pain.  Weakness reported in R knee which is pre-existing. She denies numbness/tingling. She has tried heat, ice, and tylenol (doesn't take ibuprofen at the recommendation of her neurosurgeon) with minimal relief.   Pain:  Pain Intensity: Present: 0/10, Best: 0/10, Worst: 3/10 Pain location: Right low back into the R posterior hip Pain Quality:  pressure   Radiating: Yes down the lateral right thigh to the knee (history of knee pain previously) Numbness/Tingling: No Focal Weakness: Yes, R knee weakness which is pre-existing; Aggravating factors: bending forward, climbing stairs (holds onto banister, more difficulty  descending), extending standing Standing tolerance: 20 minutes; Relieving factors: prednisone, sitting 24-hour pain behavior: fluctuates History of prior back injury, pain, surgery, or therapy: No Falls: Has patient fallen in last 6 months? 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 Multilevel spondylolisthesis. Mild subarticular stenosis bilaterally L1-2. Mild spinal stenosis L2-3 with moderate subarticular stenosis bilaterally. Mild subarticular stenosis bilaterally L3-4. L5 is sacralized. R hip MRI 01/17/20: R hip WNL, R knee radiographs 07/14/21: Mild lateral deviation and tilt to the patella within the trochlea.  No fractures or dislocations.  No soft tissue swelling or joint effusion. There are moderate tricompartmental degenerative joint changes worse in the medial compartment.  The medial compartment has joint space narrowing, osteophyte formation, subchondral sclerosis.  No loose bodies. No abnormal bone lesions. 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 They live in [place], [place]. They have a daughter and grandchildren who live 2 hours west of them [facility]).  Hobbies: knit and crochet, read Red flags: Positive: Hx of thyroid cancer, Negative: bowel/bladder changes, saddle paresthesia, h/o spinal tumors, h/o compression fx, abdominal pain, chills/fever, night sweats, nausea, vomiting, unrelenting pain  Precautions: None  Weight Bearing Restrictions: No  Living Environment Lives with: lives alone Lives in: House/apartment, ground floor apartment (3 story apartment, 14 steps with handrail on the right during ascend to second level) Has following equipment at home: Single point cane  Patient Goals: Improve R low back pain and improve RLE strength   OBJECTIVE:   Patient  Surveys  FOTO 50, predicted improvement to 46   Cognition Patient is oriented to person, place, and time.  Recent memory is intact.  Remote memory is intact.  Attention span and concentration are intact.  Expressive speech is intact.  Patient's fund of knowledge is within normal limits for educational level.   Gross Musculoskeletal Assessment Tremor: None Bulk: Normal Tone: Normal No erythema/bruising, warmth or edema to low back. No visible step-off along spinal column;  GAIT: Deferred full gait assessment;  Posture: Lumbar lordosis: WNL Iliac crest height: Equal bilaterally Lumbar lateral shift: Negative   AROM  AROM (Normal range in degrees) AROM  10/22/2021  Lumbar   Flexion (65) Decreased lower lumbar segmental flexion  Extension (30) WNL  Right lateral flexion (25) Mild limitation  Left lateral flexion (25) Mild limitation  Right rotation (30) Mild limitation*  Left rotation (30) Mild limitation*      Hip Right Left  Flexion (125) WNL WNL  Extension (15)    Abduction (40)    Adduction     Internal Rotation (45)    External Rotation (45)        Knee    Flexion (135) WNL WNL  Extension (0) WNL WNL      Ankle    Dorsiflexion (20)    Plantarflexion (50)    Inversion (35)    Eversion (15    (* = pain; Blank rows = not tested)  LE MMT:  MMT (out of 5) Right 10/22/2021 Left 10/22/2021  Hip flexion 3+ 4+  Hip extension    Hip abduction    Hip adduction    Hip internal rotation 5 5  Hip external rotation 5 5  Knee flexion 4+ 5  Knee extension 5 5  Ankle dorsiflexion 4 5  Ankle plantarflexion    Ankle inversion    Ankle eversion    (* = pain; Blank rows = not tested)   Sensation Grossly intact to light touch bilateral LEs as determined by testing dermatomes L2-S2. Proprioception, and hot/cold testing deferred on this date.   Reflexes Deferred   Muscle Length Hamstrings: R: Not examined L: Not examined [place] (quadriceps): R: Not examined  L: Not examined Thomas (hip flexors): R: Not examined L: Not examined Ober: R: Not examined L: Not examined   Palpation  Location LEFT  RIGHT           Lumbar paraspinals 0 1  Quadratus Lumborum 0 0  Gluteus Maximus 0 1  Gluteus Medius    Deep hip external rotators    PSIS    Fortin's Area (SIJ)    Greater Trochanter    (Blank rows = not tested) Graded on 0-4 scale (0 = no pain, 1 = pain, 2 = pain with wincing/grimacing/flinching, 3 = pain with withdrawal, 4 = unwilling to allow palpation), (Blank rows = not tested)   Passive Accessory Intervertebral Motion Deferred    SPECIAL TESTS Lumbar Radiculopathy and Discogenic: Centralization and Peripheralization (SN 92, -LR 0.12): Not examined Slump (SN 83, -LR 0.32): R: Negative L: Negative SLR (SN 92, -LR 0.29): R: Negative L:  Negative Crossed SLR (SP 90): R: Negative L: Negative  Facet Joint: Extension-Rotation (SN 100, -LR 0.0): R: Negative L: Negative  Lumbar Foraminal Stenosis: Lumbar quadrant (SN 70): R: Negative L: Negative  Hip: FABER (SN 81): R: Negative L: Positive for back pain FADIR (SN 94): R: Negative L: Positive for back pain Hip scour (SN 50): R: Positive for back pain L: Positive for back pain  SIJ:  Thigh Thrust (SN 88, -LR 0.18) : R: Not examined L: Not examined  Piriformis Syndrome: FAIR Test (SN 88, SP 83): R: Not examined L: Not examined  Functional Tasks Deferred  Beighton scale: Deferred, pt denies history of hypermobility;   TODAY'S TREATMENT  None  PATIENT EDUCATION:  Education details: Plan of care Person educated: Patient Education method: Explanation Education comprehension: verbalized understanding   HOME EXERCISE PROGRAM: None currently   ASSESSMENT:  CLINICAL IMPRESSION: Patient is a 76 y.o. female who was seen today for physical therapy evaluation and treatment for back pain. Objective impairments include decreased ROM, decreased  strength, and pain. These impairments  are limiting patient from community activity. Personal factors including Age, Past/current experiences, Time since onset of injury/illness/exacerbation, and 1-2 comorbidities: cavernous hemangioma, osteoporosis  are also affecting patient's functional outcome. Patient will benefit from skilled PT to address above impairments and improve overall function.  REHAB POTENTIAL: Fair    CLINICAL DECISION MAKING: Evolving/moderate complexity  EVALUATION COMPLEXITY: Moderate   GOALS: Goals reviewed with patient? No  SHORT TERM GOALS: Target date: 11/17/2021  Pt will be independent with HEP to improve strength and decrease back pain to improve pain-free function at home and work. Baseline:  Goal status: INITIAL   LONG TERM GOALS: Target date: 12/15/2021  Pt will increase FOTO to at least 57 to demonstrate significant improvement in function at home and work related to back pain  Baseline: 10/20/21: 50;  Goal status: INITIAL  2.  Pt will decrease worst back pain by at least 2 points on the NPRS in order to demonstrate clinically significant reduction in back pain. Baseline: 10/20/21: worst: 3/10 Goal status: INITIAL  3.  Pt will decrease mODI score by at least 13 points in order demonstrate clinically significant reduction in back pain/disability.       Baseline: 10/20/21: To be completed Goal status: INITIAL   PLAN: PT FREQUENCY: 2x/week  PT DURATION: 8 weeks  PLANNED INTERVENTIONS: Therapeutic exercises, Therapeutic activity, Neuromuscular re-education, Balance training, Gait training, Patient/Family education, Joint manipulation, Joint mobilization, Canalith repositioning, Aquatic Therapy, Dry Needling, Cognitive remediation, Electrical stimulation, Spinal manipulation, Spinal mobilization, Cryotherapy, Moist heat, Traction, Ultrasound, Ionotophoresis '4mg'$ /ml Dexamethasone, and Manual therapy  PLAN FOR NEXT SESSION: Lumbar PAM assessment, additional LE strength testing, measure hip ROM,  Consider 5TSTS/TUG/BERG/30s Sit to [place], update goals as necessary, issue HEP, initiate strengthening;   Lyndel Safe Madisyn Mawhinney PT, DPT, GCS  Elnita Surprenant 10/22/2021, 10:10 AM

## 2021-10-23 ENCOUNTER — Ambulatory Visit: Payer: Medicare HMO

## 2021-10-23 DIAGNOSIS — M5459 Other low back pain: Secondary | ICD-10-CM | POA: Diagnosis not present

## 2021-10-23 DIAGNOSIS — M6281 Muscle weakness (generalized): Secondary | ICD-10-CM

## 2021-10-23 NOTE — Therapy (Signed)
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[place])    Onychomycosis    Osteoporosis    Past Surgical History:  Procedure Laterality Date   CESAREAN SECTION     COLONOSCOPY WITH PROPOFOL N/A 10/04/2017   Procedure: COLONOSCOPY WITH PROPOFOL;  Surgeon: Manya Silvas, MD;  Location: [facility] ENDOSCOPY;  Service: Endoscopy;  Laterality: N/A;   ESOPHAGOGASTRODUODENOSCOPY N/A 06/25/2014   Procedure: ESOPHAGOGASTRODUODENOSCOPY (EGD);  Surgeon: Manya Silvas, MD;  Location: [facility] ENDOSCOPY;  Service: Endoscopy;  Laterality: N/A;   LAPAROSCOPIC BILATERAL SALPINGO OOPHERECTOMY Bilateral 04/12/2020   Procedure: LAPAROSCOPIC BILATERAL SALPINGO OOPHORECTOMY;  Surgeon: Schermerhorn, Gwen Her, MD;  Location: [facility] ORS;  Service: Gynecology;  Laterality: Bilateral;   monitoring cranial nerves bilateral     OOPHORECTOMY Bilateral    SAVORY DILATION N/A 06/25/2014   Procedure: SAVORY DILATION;  Surgeon: Manya Silvas, MD;  Location: [facility] ENDOSCOPY;  Service: Endoscopy;  Laterality: N/A;   TONSILLECTOMY     TOTAL THYROIDECTOMY     There are no problems to display for this patient.   PCP: Sallee Lange, NP  REFERRING PROVIDER: Sallee Lange, *  REFERRING DIAGNOSIS: M54.50 (ICD-10-CM) - Low back pain, unspecified, R29.898 (ICD-10-CM) - Other symptoms and signs involving the musculoskeletal system, M62.830  (ICD-10-CM) - Muscle spasm of back  THERAPY DIAG: Other low back pain  Muscle weakness (generalized)  RATIONALE FOR EVALUATION AND TREATMENT: Rehabilitation  ONSET DATE: 09/17/21 (approximate)  FOLLOW UP APPT WITH PROVIDER: No   FROM INITIAL EVALUATION (10/20/21) SUBJECTIVE:                                                                                                                                                                                         Chief Complaint: R low back pain  Pertinent History Presents referred to physical therapy for acute lower back pain which started around 09/17/21 upon waking. No known trauma or precipitating event.  Pain was localized initially in the back but eventually started radiating down the RLE to the level of the knee. She was prescribed a steroid taper which helped considerably. "My back is feeling pretty good today." She reports approximately 85% improvement in her back symptoms since onset but continues with symptoms in the R low back. Pt has a history of L sided hemangioma that is "the size of my brainstem" and affects the strength in the right side of her body (BUE/BLE). She has been advised to wait for any surgical intervention until it causes severe mobility impairments because of the risks involved with the procedure. No prior history of prior significant back pain.  Weakness reported in R knee which is pre-existing. She denies numbness/tingling. She has tried heat, ice, and tylenol (doesn't take ibuprofen at the recommendation of her neurosurgeon) with minimal relief.   Pain:  Pain Intensity: Present: 0/10, Best: 0/10, Worst: 3/10 Pain location: Right low back into the R posterior hip Pain Quality:  pressure   Radiating: Yes down the lateral right thigh to the knee (history of knee pain previously) Numbness/Tingling: No Focal Weakness: Yes, R knee weakness which is pre-existing; Aggravating factors: bending forward, climbing stairs (holds  onto banister, more difficulty descending), extending standing Standing tolerance: 20 minutes; Relieving factors: prednisone, sitting 24-hour pain behavior: fluctuates History of prior back injury, pain, surgery, or therapy: No Falls: Has patient fallen in last 6 months? 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 Multilevel spondylolisthesis. Mild subarticular stenosis bilaterally L1-2. Mild spinal stenosis L2-3 with moderate subarticular stenosis bilaterally. Mild subarticular stenosis bilaterally L3-4. L5 is sacralized. R hip MRI 01/17/20: R hip WNL, R knee radiographs 07/14/21: Mild lateral deviation and tilt to the patella within the trochlea.  No fractures or dislocations.  No soft tissue swelling or joint effusion. There are moderate tricompartmental degenerative joint changes worse in the medial compartment.  The medial compartment has joint space narrowing, osteophyte formation, subchondral sclerosis.  No loose bodies. No abnormal bone lesions. 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 They live in [place], [place]. They have a daughter and grandchildren who live 2 hours west of them [facility]).  Hobbies: knit and crochet, read Red flags: Positive: Hx of thyroid cancer, Negative: bowel/bladder changes, saddle paresthesia, h/o spinal tumors, h/o compression fx, abdominal pain, chills/fever, night sweats, nausea, vomiting, unrelenting pain  Precautions: None  Weight Bearing Restrictions: No  Living Environment Lives with: lives alone Lives in: House/apartment, ground floor apartment (3 story apartment, 14 steps with handrail on the right during ascend to second level) Has following equipment at home: Single point cane  Patient Goals: Improve R low back pain and improve RLE  strength   OBJECTIVE:   Patient Surveys  FOTO 50, predicted improvement to 29  Cognition Patient is oriented to person, place, and time.  Recent memory is intact.  Remote memory is intact.  Attention span and concentration are intact.  Expressive speech is intact.  Patient's fund of knowledge is within normal limits for educational level.   Gross Musculoskeletal Assessment Tremor: None Bulk: Normal Tone: Normal No erythema/bruising, warmth or edema to low back. No visible step-off along spinal column;  GAIT: Deferred full gait assessment;  Posture: Lumbar lordosis: WNL Iliac crest height: Equal bilaterally Lumbar lateral shift: Negative  AROM  AROM (Normal range in degrees) AROM  10/23/2021  Lumbar   Flexion (65) Decreased lower lumbar segmental flexion  Extension (30) WNL  Right lateral flexion (25) Mild limitation  Left lateral flexion (25) Mild limitation  Right rotation (30) Mild limitation*  Left rotation (30) Mild limitation*      Hip Right Left  Flexion (125) WNL WNL  Extension (15)    Abduction (40)    Adduction     Internal Rotation (45)    External Rotation (45)        Knee    Flexion (135) WNL WNL  Extension (0) WNL WNL      Ankle    Dorsiflexion (20)    Plantarflexion (50)    Inversion (35)    Eversion (15    (* = pain; Blank rows = not tested)  LE MMT:  MMT (out of 5) Right 10/23/2021 Left 10/23/2021  Hip flexion 3+ 4+  Hip extension    Hip abduction    Hip adduction    Hip internal rotation 5 5  Hip external rotation 5 5  Knee flexion 4+ 5  Knee extension 5 5  Ankle dorsiflexion 4 5  Ankle plantarflexion    Ankle inversion    Ankle eversion    (* = pain; Blank rows = not tested)  Sensation Grossly intact to light touch bilateral LEs as determined by testing dermatomes L2-S2. Proprioception, and hot/cold testing deferred on this date.  Reflexes Deferred  Muscle Length Hamstrings: R: Not examined L: Not examined [place]  (quadriceps): R: Not examined L: Not examined Thomas (hip flexors): R: Not examined L: Not examined Ober: R: Not examined L: Not examined  Palpation  Location LEFT  RIGHT           Lumbar paraspinals 0 1  Quadratus Lumborum 0 0  Gluteus Maximus 0 1  Gluteus Medius    Deep hip external rotators    PSIS    Fortin's Area (SIJ)    Greater Trochanter    (Blank rows = not tested) Graded on 0-4 scale (0 = no pain, 1 = pain, 2 = pain with wincing/grimacing/flinching, 3 = pain with withdrawal, 4 = unwilling to allow palpation), (Blank rows = not tested)  Passive Accessory Intervertebral Motion Deferred  SPECIAL TESTS Lumbar Radiculopathy and Discogenic: Centralization and Peripheralization (SN 92, -LR 0.12): Not examined Slump (SN 83, -LR 0.32): R: Negative L: Negative SLR (SN 92, -LR 0.29): R: Negative L:  Negative Crossed SLR (SP 90): R: Negative L: Negative  Facet Joint: Extension-Rotation (SN 100, -LR 0.0): R: Negative L: Negative  Lumbar Foraminal Stenosis: Lumbar quadrant (SN 70): R: Negative L: Negative  Hip: FABER (SN 81): R: Negative L: Positive for back pain FADIR (SN 94): R: Negative L: Positive for back pain Hip scour (SN 50): R: Positive for back pain L: Positive for back pain  SIJ:  Thigh Thrust (SN 88, -LR 0.18) : R: Not examined L: Not examined  Piriformis Syndrome: FAIR Test (SN 88, SP 83): R: Not examined L: Not examined  Functional Tasks Deferred  Beighton scale: Deferred, pt denies history of hypermobility;   TODAY'S TREATMENT    SUBJECTIVE: Pt reports R anterior hip/groin pain today especially when entering/exiting her car. No significant changes since the initial evaluation.  PAIN: R anterior hip/groin pain, not rated on NPRS   Ther-ex  NuStep L1 BLE x 5 minutes with moist heat pack to low back during interval history; Hooklying lumbar rotation rocking x 60s; Hooklying alternating hip flexion marching x 10  BLE; Hooklying clams with manual  resistance x 10; Hooklying adductor squeeze with manual resistance x 10; Supine SLR hip abduction and adduction with manual resistance x 10 each; HEP issued and reviewed with patient;   Neuromuscular Re-education  5TSTS: 15.0s TUG: 14.5s BERG: 51/56;   PATIENT EDUCATION:  Education details: Plan of care and HEP Person educated: Patient Education method: Explanation and handout Education comprehension: verbalized understanding   HOME EXERCISE PROGRAM: Access Code: [email or web address] URL: [email or web address] Date: 10/23/2021 Prepared by: Roxana Hires  Exercises - Supine March  - 1-2 x daily - 7 x weekly - 2 sets - 10 reps - 2-3s hold - Supine Lower Trunk Rotation  - 1-2 x daily - 7 x weekly - 2 sets - 10 reps - 2-3s hold   ASSESSMENT:  CLINICAL IMPRESSION: Patient demonstrates excellent motivation during session today. Focused on light pain-free strengthening for RLE during session today. Also performed additional outcome measures with patient. Her 5TSTS and TUG are grossly WNL. She has some mild balance deficits scoring 51/56 on the BERG. HEP issued to patient and pt encouraged to follow-up as scheduled. Will continue to progress RLE strengthening at follow-up sessions. Patient will benefit from skilled PT to address above impairments and improve overall function.  REHAB POTENTIAL: Fair    CLINICAL DECISION MAKING: Evolving/moderate complexity  EVALUATION COMPLEXITY: Moderate   GOALS: Goals reviewed with patient? No  SHORT TERM GOALS: Target date: 11/17/2021  Pt will be independent with HEP to improve strength and decrease back pain to improve pain-free function at home and work. Baseline:  Goal status: INITIAL   LONG TERM GOALS: Target date: 12/15/2021  Pt will increase FOTO to at least 57 to demonstrate significant improvement in function at home and work related to back pain  Baseline: 10/20/21: 50;  Goal status: INITIAL  2.  Pt will decrease worst  back pain by at least 2 points on the NPRS in order to demonstrate clinically significant reduction in back pain. Baseline: 10/20/21: worst: 3/10 Goal status: INITIAL  3.  Pt will decrease mODI score by at least 13 points in order demonstrate clinically significant reduction in back pain/disability.       Baseline: 10/20/21: To be completed Goal status: INITIAL   PLAN: PT FREQUENCY: 2x/week  PT DURATION: 8 weeks  PLANNED INTERVENTIONS: Therapeutic exercises, Therapeutic activity, Neuromuscular re-education, Balance training, Gait training, Patient/Family education, Joint manipulation, Joint mobilization, Canalith repositioning, Aquatic Therapy, Dry Needling, Cognitive remediation, Electrical stimulation, Spinal manipulation, Spinal mobilization, Cryotherapy, Moist heat, Traction, Ultrasound, Ionotophoresis '4mg'$ /ml Dexamethasone, and Manual therapy  PLAN FOR NEXT SESSION: Lumbar PAM assessment, mODI, additional LE strength testing, measure hip ROM, 2MWT, update goals as necessary, review/modify HEP as needed, continue strengthening, update goals as necessary   Lyndel Safe Suni Jarnagin PT, DPT, GCS  Markale Birdsell 10/23/2021, 9:29 PM

## 2021-10-29 NOTE — Therapy (Signed)
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[place])    Onychomycosis    Osteoporosis    Past Surgical History:  Procedure Laterality Date   CESAREAN SECTION     COLONOSCOPY WITH PROPOFOL N/A 10/04/2017   Procedure: COLONOSCOPY WITH PROPOFOL;  Surgeon: Manya Silvas, MD;  Location: [facility] ENDOSCOPY;  Service: Endoscopy;  Laterality: N/A;   ESOPHAGOGASTRODUODENOSCOPY N/A 06/25/2014   Procedure: ESOPHAGOGASTRODUODENOSCOPY (EGD);  Surgeon: Manya Silvas, MD;  Location: [facility] ENDOSCOPY;  Service: Endoscopy;  Laterality: N/A;   LAPAROSCOPIC BILATERAL SALPINGO OOPHERECTOMY Bilateral 04/12/2020   Procedure: LAPAROSCOPIC BILATERAL SALPINGO OOPHORECTOMY;  Surgeon: Schermerhorn, Gwen Her, MD;  Location: [facility] ORS;  Service: Gynecology;  Laterality: Bilateral;   monitoring cranial nerves bilateral     OOPHORECTOMY Bilateral    SAVORY DILATION N/A 06/25/2014   Procedure: SAVORY DILATION;  Surgeon: Manya Silvas, MD;  Location: [facility] ENDOSCOPY;  Service: Endoscopy;  Laterality: N/A;   TONSILLECTOMY     TOTAL THYROIDECTOMY     There are no problems to display for this patient.   PCP: Sallee Lange, NP  REFERRING PROVIDER: Sallee Lange, *  REFERRING DIAGNOSIS: M54.50 (ICD-10-CM) - Low back pain, unspecified, R29.898 (ICD-10-CM) - Other symptoms and signs involving the musculoskeletal system, M62.830  (ICD-10-CM) - Muscle spasm of back  THERAPY DIAG: Other low back pain  Muscle weakness (generalized)  RATIONALE FOR EVALUATION AND TREATMENT: Rehabilitation  ONSET DATE: 09/17/21 (approximate)  FOLLOW UP APPT WITH PROVIDER: No   FROM INITIAL EVALUATION (10/20/21) SUBJECTIVE:                                                                                                                                                                                         Chief Complaint: R low back pain  Pertinent History Presents referred to physical therapy for acute lower back pain which started around 09/17/21 upon waking. No known trauma or precipitating event. Pain  was localized initially in the back but eventually started radiating down the RLE to the level of the knee. She was prescribed a steroid taper which helped considerably. "My back is feeling pretty good today." She reports approximately 85% improvement in her back symptoms since onset but continues with symptoms in the R low back. Pt has a history of L sided hemangioma that is "the size of my brainstem" and affects the strength in the right side of her body (BUE/BLE). She has been advised to wait for any surgical intervention until it causes severe mobility impairments because of the risks involved with the procedure. No prior history of prior significant back pain.  Weakness reported in R knee which is pre-existing. She denies numbness/tingling. She has tried heat, ice, and tylenol (doesn't take ibuprofen at the recommendation of her neurosurgeon) with minimal relief.   Pain:  Pain Intensity: Present: 0/10, Best: 0/10, Worst: 3/10 Pain location: Right low back into the R posterior hip Pain Quality:  pressure   Radiating: Yes down the lateral right thigh to the knee (history of knee pain previously) Numbness/Tingling: No Focal Weakness: Yes, R knee weakness which is pre-existing; Aggravating factors: bending forward, climbing stairs (holds  onto banister, more difficulty descending), extending standing Standing tolerance: 20 minutes; Relieving factors: prednisone, sitting 24-hour pain behavior: fluctuates History of prior back injury, pain, surgery, or therapy: No Falls: Has patient fallen in last 6 months? 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 Multilevel spondylolisthesis. Mild subarticular stenosis bilaterally L1-2. Mild spinal stenosis L2-3 with moderate subarticular stenosis bilaterally. Mild subarticular stenosis bilaterally L3-4. L5 is sacralized. R hip MRI 01/17/20: R hip WNL, R knee radiographs 07/14/21: Mild lateral deviation and tilt to the patella within the trochlea.  No fractures or dislocations.  No soft tissue swelling or joint effusion. There are moderate tricompartmental degenerative joint changes worse in the medial compartment.  The medial compartment has joint space narrowing, osteophyte formation, subchondral sclerosis.  No loose bodies. No abnormal bone lesions. 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 They live in [place], [place]. They have a daughter and grandchildren who live 2 hours west of them [facility]).  Hobbies: knit and crochet, read Red flags: Positive: Hx of thyroid cancer, Negative: bowel/bladder changes, saddle paresthesia, h/o spinal tumors, h/o compression fx, abdominal pain, chills/fever, night sweats, nausea, vomiting, unrelenting pain  Precautions: None  Weight Bearing Restrictions: No  Living Environment Lives with: lives alone Lives in: House/apartment, ground floor apartment (3 story apartment, 14 steps with handrail on the right during ascend to second level) Has following equipment at home: Single point cane  Patient Goals: Improve R low back pain and improve RLE  strength   OBJECTIVE:   Patient Surveys  FOTO 50, predicted improvement to 42  Cognition Patient is oriented to person, place, and time.  Recent memory is intact.  Remote memory is intact.  Attention span and concentration are intact.  Expressive speech is intact.  Patient's fund of knowledge is within normal limits for educational level.   Gross Musculoskeletal Assessment Tremor: None Bulk: Normal Tone: Normal No erythema/bruising, warmth or edema to low back. No visible step-off along spinal column;  GAIT: Deferred full gait assessment;  Posture: Lumbar lordosis: WNL Iliac crest height: Equal bilaterally Lumbar lateral shift: Negative  AROM  AROM (Normal range in degrees) AROM  10/30/2021  Lumbar   Flexion (65) Decreased lower lumbar segmental flexion  Extension (30) WNL  Right lateral flexion (25) Mild limitation  Left lateral flexion (25) Mild limitation  Right rotation (30) Mild limitation*  Left rotation (30) Mild limitation*      Hip Right Left  Flexion (125) WNL WNL  Extension (15)    Abduction (40)    Adduction     Internal Rotation (45)    External Rotation (45)        Knee    Flexion (135) WNL WNL  Extension (0) WNL WNL      Ankle    Dorsiflexion (20)    Plantarflexion (50)    Inversion (35)    Eversion (15    (* = pain; Blank rows = not tested)  LE MMT:  MMT (out of 5) Right 10/30/2021 Left 10/30/2021  Hip flexion 3+ 4+  Hip extension    Hip abduction    Hip adduction    Hip internal rotation 5 5  Hip external rotation 5 5  Knee flexion 4+ 5  Knee extension 5 5  Ankle dorsiflexion 4 5  Ankle plantarflexion    Ankle inversion    Ankle eversion    (* = pain; Blank rows = not tested)  Sensation Grossly intact to light touch bilateral LEs as determined by testing dermatomes L2-S2. Proprioception, and hot/cold testing deferred on this date.  Reflexes Deferred  Muscle Length Hamstrings: R: Not examined L: Not examined [place]  (quadriceps): R: Not examined L: Not examined Thomas (hip flexors): R: Not examined L: Not examined Ober: R: Not examined L: Not examined  Palpation  Location LEFT  RIGHT           Lumbar paraspinals 0 1  Quadratus Lumborum 0 0  Gluteus Maximus 0 1  Gluteus Medius    Deep hip external rotators    PSIS    Fortin's Area (SIJ)    Greater Trochanter    (Blank rows = not tested) Graded on 0-4 scale (0 = no pain, 1 = pain, 2 = pain with wincing/grimacing/flinching, 3 = pain with withdrawal, 4 = unwilling to allow palpation), (Blank rows = not tested)  Passive Accessory Intervertebral Motion Deferred  SPECIAL TESTS Lumbar Radiculopathy and Discogenic: Centralization and Peripheralization (SN 92, -LR 0.12): Not examined Slump (SN 83, -LR 0.32): R: Negative L: Negative SLR (SN 92, -LR 0.29): R: Negative L:  Negative Crossed SLR (SP 90): R: Negative L: Negative  Facet Joint: Extension-Rotation (SN 100, -LR 0.0): R: Negative L: Negative  Lumbar Foraminal Stenosis: Lumbar quadrant (SN 70): R: Negative L: Negative  Hip: FABER (SN 81): R: Negative L: Positive for back pain FADIR (SN 94): R: Negative L: Positive for back pain Hip scour (SN 50): R: Positive for back pain L: Positive for back pain  SIJ:  Thigh Thrust (SN 88, -LR 0.18) : R: Not examined L: Not examined  Piriformis Syndrome: FAIR Test (SN 88, SP 83): R: Not examined L: Not examined  Functional Tasks Deferred  Beighton scale: Deferred, pt denies history of hypermobility;   TODAY'S TREATMENT    SUBJECTIVE: Pt reports significant improvement in low back pain since starting with therapy. She is experiencing continued R anterior hip/groin pain today with worsening R knee pain recently.   PAIN: R anterior hip/groin pain and R knee pain, not rated on NPRS   Ther-ex  NuStep L1-2 BLE x 6 minutes with moist heat pack to low back during interval history;  Hooklying lumbar rotation rocking x 60s; Hooklying alternating  hip flexion marching x 10 BLE; Hooklying clams with manual resistance 2 x 10; Hooklying adductor squeeze with manual resistance 2 x 10; Supine R SLR hip flexion x 10; HEP updated and reviewed with patient;   Manual Therapy  Supine R hip FABER stretch 2 x 30s; Supine R hip AP mobilizations at neutral, grade II, 30s/bout x 2 bouts; Supine R hip long axis distraction, grade II, 30s/bout x 3 bouts; Supine R hip inferior mobilizations with hip flexed to 45 degrees, grade II, 30s/bout x 3 bouts; Supine R hip medial to lateral mobilizations, grade II, 30s/bout x 3 bouts;   PATIENT EDUCATION:  Education details: Pt educated throughout session about proper posture and technique with exercises. Improved exercise technique, movement at target joints, use of target muscles after min to mod verbal, visual, tactile cues, HEP modifications Person educated: Patient Education method: Explanation and handout Education comprehension: verbalized understanding   HOME EXERCISE PROGRAM: Access Code: [email or web address] URL: [email or web address] Date: 10/30/2021 Prepared by: Roxana Hires  Exercises - Supine March  - 1-2 x daily - 7 x weekly - 2 sets - 10 reps - 2-3s hold - Supine Lower Trunk Rotation  - 1-2 x daily - 7 x weekly - 2 sets - 10 reps - 2-3s hold - Hooklying Clamshell with Resistance  - 1-2 x daily - 7 x weekly - 2 sets - 10 reps - 2-3s hold - Supine Hip Adduction Isometric with Ball  - 1-2 x daily - 7 x weekly - 2 sets - 10 reps - 2-3s hold   ASSESSMENT:  CLINICAL IMPRESSION: Patient demonstrates excellent motivation during session today. Continued to focused on light pain-free strengthening for RLE during session today. Also introduced manual therapy techniques today to help manage R hip pain. HEP updated during session and pt encouraged to follow-up as scheduled. Will continue to progress RLE strengthening at follow-up sessions. Patient will benefit from skilled PT to address above  impairments and improve overall function.  REHAB POTENTIAL: Fair    CLINICAL DECISION MAKING: Evolving/moderate complexity  EVALUATION COMPLEXITY: Moderate   GOALS: Goals reviewed with patient? No  SHORT TERM GOALS: Target date: 11/17/2021  Pt will be independent with HEP to improve strength and decrease back pain to improve pain-free function at home and work. Baseline:  Goal status: INITIAL   LONG TERM GOALS: Target date: 12/15/2021  Pt will increase FOTO to at least 57 to demonstrate significant improvement in function at home and work related to back pain  Baseline: 10/20/21: 50;  Goal status: INITIAL  2.  Pt will decrease worst back pain by at least 2 points on the NPRS in order to demonstrate clinically significant reduction in back pain. Baseline: 10/20/21: worst: 3/10 Goal status: INITIAL  3.  Pt will decrease mODI score by at least 13 points in order demonstrate clinically significant reduction in back pain/disability.       Baseline: 10/20/21: To be completed Goal status: INITIAL   PLAN: PT FREQUENCY: 2x/week  PT DURATION: 8 weeks  PLANNED INTERVENTIONS: Therapeutic exercises, Therapeutic activity, Neuromuscular re-education, Balance training, Gait training, Patient/Family education, Joint manipulation, Joint mobilization, Canalith repositioning, Aquatic Therapy, Dry Needling, Cognitive remediation, Electrical stimulation, Spinal manipulation, Spinal mobilization, Cryotherapy, Moist heat, Traction, Ultrasound, Ionotophoresis '4mg'$ /ml Dexamethasone, and Manual therapy  PLAN FOR NEXT SESSION: mODI, review/modify HEP as needed, continue strengthening, manual techniques as needed for pain control   Lyndel Safe Ellene Bloodsaw PT, DPT, GCS  Karla Vines 10/30/2021, 1:00 PM

## 2021-10-30 ENCOUNTER — Ambulatory Visit: Payer: Medicare HMO

## 2021-10-30 DIAGNOSIS — M5459 Other low back pain: Secondary | ICD-10-CM

## 2021-10-30 DIAGNOSIS — M6281 Muscle weakness (generalized): Secondary | ICD-10-CM

## 2021-11-05 NOTE — Therapy (Signed)
OUTPATIENT PHYSICAL THERAPY THORACOLUMBAR TREATMENT  Patient Name: Shiva Karis MRN: [id number] DOB:September 23, 1945, 76 y.o., female Today's Date: 11/06/2021  PT End of Session - 11/06/21 0831     Visit Number 4    Number of Visits 17    Date for PT Re-Evaluation 12/15/21    Authorization Type eval: 10/20/21    PT Start Time 0832    PT Stop Time 0917    PT Time Calculation (min) 45 min    Activity Tolerance Patient tolerated treatment well    Behavior During Therapy [facility] for tasks assessed/performed            Past Medical History:  Diagnosis Date   Cancer ([place])    thyroid ca   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[place])    Onychomycosis    Osteoporosis    Past Surgical History:  Procedure Laterality Date   CESAREAN SECTION     COLONOSCOPY WITH PROPOFOL N/A 10/04/2017   Procedure: COLONOSCOPY WITH PROPOFOL;  Surgeon: Manya Silvas, MD;  Location: [facility] ENDOSCOPY;  Service: Endoscopy;  Laterality: N/A;   ESOPHAGOGASTRODUODENOSCOPY N/A 06/25/2014   Procedure: ESOPHAGOGASTRODUODENOSCOPY (EGD);  Surgeon: Manya Silvas, MD;  Location: [facility] ENDOSCOPY;  Service: Endoscopy;  Laterality: N/A;   LAPAROSCOPIC BILATERAL SALPINGO OOPHERECTOMY Bilateral 04/12/2020   Procedure: LAPAROSCOPIC BILATERAL SALPINGO OOPHORECTOMY;  Surgeon: Schermerhorn, Gwen Her, MD;  Location: [facility] ORS;  Service: Gynecology;  Laterality: Bilateral;   monitoring cranial nerves bilateral     OOPHORECTOMY Bilateral    SAVORY DILATION N/A 06/25/2014   Procedure: SAVORY DILATION;  Surgeon: Manya Silvas, MD;  Location: [facility] ENDOSCOPY;  Service: Endoscopy;  Laterality: N/A;   TONSILLECTOMY     TOTAL THYROIDECTOMY     There are no problems to display for this patient.  PCP: Sallee Lange, NP  REFERRING PROVIDER: Sallee Lange, *  REFERRING DIAGNOSIS: M54.50 (ICD-10-CM) - Low back pain, unspecified, R29.898 (ICD-10-CM) - Other symptoms and signs involving the musculoskeletal system, M62.830  (ICD-10-CM) - Muscle spasm of back  THERAPY DIAG: Other low back pain  Muscle weakness (generalized)  RATIONALE FOR EVALUATION AND TREATMENT: Rehabilitation  ONSET DATE: 09/17/21 (approximate)  FOLLOW UP APPT WITH PROVIDER: No   FROM INITIAL EVALUATION (10/20/21) SUBJECTIVE:                                                                                                                                                                                         Chief Complaint: R low back pain  Pertinent History Presents referred to physical therapy for acute lower back pain which started around 09/17/21 upon waking. No known trauma or precipitating event. Pain was  localized initially in the back but eventually started radiating down the RLE to the level of the knee. She was prescribed a steroid taper which helped considerably. "My back is feeling pretty good today." She reports approximately 85% improvement in her back symptoms since onset but continues with symptoms in the R low back. Pt has a history of L sided hemangioma that is "the size of my brainstem" and affects the strength in the right side of her body (BUE/BLE). She has been advised to wait for any surgical intervention until it causes severe mobility impairments because of the risks involved with the procedure. No prior history of prior significant back pain.  Weakness reported in R knee which is pre-existing. She denies numbness/tingling. She has tried heat, ice, and tylenol (doesn't take ibuprofen at the recommendation of her neurosurgeon) with minimal relief.   Pain:  Pain Intensity: Present: 0/10, Best: 0/10, Worst: 3/10 Pain location: Right low back into the R posterior hip Pain Quality:  pressure   Radiating: Yes down the lateral right thigh to the knee (history of knee pain previously) Numbness/Tingling: No Focal Weakness: Yes, R knee weakness which is pre-existing; Aggravating factors: bending forward, climbing stairs (holds  onto banister, more difficulty descending), extending standing Standing tolerance: 20 minutes; Relieving factors: prednisone, sitting 24-hour pain behavior: fluctuates History of prior back injury, pain, surgery, or therapy: No Falls: Has patient fallen in last 6 months? 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 Multilevel spondylolisthesis. Mild subarticular stenosis bilaterally L1-2. Mild spinal stenosis L2-3 with moderate subarticular stenosis bilaterally. Mild subarticular stenosis bilaterally L3-4. L5 is sacralized. R hip MRI 01/17/20: R hip WNL, R knee radiographs 07/14/21: Mild lateral deviation and tilt to the patella within the trochlea.  No fractures or dislocations.  No soft tissue swelling or joint effusion. There are moderate tricompartmental degenerative joint changes worse in the medial compartment.  The medial compartment has joint space narrowing, osteophyte formation, subchondral sclerosis.  No loose bodies. No abnormal bone lesions. 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 They live in [place], [place]. They have a daughter and grandchildren who live 2 hours west of them [facility]).  Hobbies: knit and crochet, read Red flags: Positive: Hx of thyroid cancer, Negative: bowel/bladder changes, saddle paresthesia, h/o spinal tumors, h/o compression fx, abdominal pain, chills/fever, night sweats, nausea, vomiting, unrelenting pain  Precautions: None  Weight Bearing Restrictions: No  Living Environment Lives with: lives alone Lives in: House/apartment, ground floor apartment (3 story apartment, 14 steps with handrail on the right during ascend to second level) Has following equipment at home: Single point cane  Patient Goals: Improve R low back pain and improve RLE  strength   OBJECTIVE:   Patient Surveys  FOTO 50, predicted improvement to 7  Cognition Patient is oriented to person, place, and time.  Recent memory is intact.  Remote memory is intact.  Attention span and concentration are intact.  Expressive speech is intact.  Patient's fund of knowledge is within normal limits for educational level.   Gross Musculoskeletal Assessment Tremor: None Bulk: Normal Tone: Normal No erythema/bruising, warmth or edema to low back. No visible step-off along spinal column;  GAIT: Deferred full gait assessment;  Posture: Lumbar lordosis: WNL Iliac crest height: Equal bilaterally Lumbar lateral shift: Negative  AROM  AROM (Normal range in degrees) AROM  11/06/2021  Lumbar   Flexion (65) Decreased lower lumbar segmental flexion  Extension (30) WNL  Right lateral flexion (25) Mild limitation  Left lateral flexion (25) Mild limitation  Right rotation (30) Mild limitation*  Left rotation (30) Mild limitation*      Hip Right Left  Flexion (125) WNL WNL  Extension (15)    Abduction (40)    Adduction     Internal Rotation (45)    External Rotation (45)        Knee    Flexion (135) WNL WNL  Extension (0) WNL WNL      Ankle    Dorsiflexion (20)    Plantarflexion (50)    Inversion (35)    Eversion (15    (* = pain; Blank rows = not tested)  LE MMT:  MMT (out of 5) Right 11/06/2021 Left 11/06/2021  Hip flexion 3+ 4+  Hip extension    Hip abduction    Hip adduction    Hip internal rotation 5 5  Hip external rotation 5 5  Knee flexion 4+ 5  Knee extension 5 5  Ankle dorsiflexion 4 5  Ankle plantarflexion    Ankle inversion    Ankle eversion    (* = pain; Blank rows = not tested)  Sensation Grossly intact to light touch bilateral LEs as determined by testing dermatomes L2-S2. Proprioception, and hot/cold testing deferred on this date.  Reflexes Deferred  Muscle Length Hamstrings: R: Not examined L: Not examined [place]  (quadriceps): R: Not examined L: Not examined Thomas (hip flexors): R: Not examined L: Not examined Ober: R: Not examined L: Not examined  Palpation  Location LEFT  RIGHT           Lumbar paraspinals 0 1  Quadratus Lumborum 0 0  Gluteus Maximus 0 1  Gluteus Medius    Deep hip external rotators    PSIS    Fortin's Area (SIJ)    Greater Trochanter    (Blank rows = not tested) Graded on 0-4 scale (0 = no pain, 1 = pain, 2 = pain with wincing/grimacing/flinching, 3 = pain with withdrawal, 4 = unwilling to allow palpation), (Blank rows = not tested)  Passive Accessory Intervertebral Motion Deferred  SPECIAL TESTS Lumbar Radiculopathy and Discogenic: Centralization and Peripheralization (SN 92, -LR 0.12): Not examined Slump (SN 83, -LR 0.32): R: Negative L: Negative SLR (SN 92, -LR 0.29): R: Negative L:  Negative Crossed SLR (SP 90): R: Negative L: Negative  Facet Joint: Extension-Rotation (SN 100, -LR 0.0): R: Negative L: Negative  Lumbar Foraminal Stenosis: Lumbar quadrant (SN 70): R: Negative L: Negative  Hip: FABER (SN 81): R: Negative L: Positive for back pain FADIR (SN 94): R: Negative L: Positive for back pain Hip scour (SN 50): R: Positive for back pain L: Positive for back pain  SIJ:  Thigh Thrust (SN 88, -LR 0.18) : R: Not examined L: Not examined  Piriformis Syndrome: FAIR Test (SN 88, SP 83): R: Not examined L: Not examined  Functional Tasks Deferred  Beighton scale: Deferred, pt denies history of hypermobility;   TODAY'S TREATMENT    SUBJECTIVE: Pt reports continued significant improvement in low back pain since starting with therapy. She continues experiencing R anterior hip/groin pain today (unchanged since last therapy session) with worsening R knee pain recently which she rates as 7/10. She has had a few episodes of R knee buckling which can be concerning for patient due to fear of falling.   PAIN: R anterior hip/groin  pain (unchanged since last  visit) and R knee pain (7/10);   Ther-ex  NuStep L1 BLE/BUE x 6:30 minutes with during interval history; Supine R quad set over towel 5s hold x 10; Supine R SLR to 45 degrees of hip flexion x 10; Supine straight leg manually resisted hip abduction/adduction x 10 each; Hooklying clams with manual resistance x 10; Hooklying adductor squeeze with manual resistance x 10; R SAQ over bolster with light manual resistance from therapist 2 x 10; Bolster bridges to decrease forces through R knee 2 x 10; HEP updated and reviewed with patient;   Manual Therapy  STM with light effleurage to R quad, hamstring, and proximal calf to help modulate pain. Applied Biofreeze to R knee afterward;   Not performed: Supine R hip FABER stretch 2 x 30s; Supine R hip AP mobilizations at neutral, grade II, 30s/bout x 2 bouts; Supine R hip long axis distraction, grade II, 30s/bout x 3 bouts; Supine R hip inferior mobilizations with hip flexed to 45 degrees, grade II, 30s/bout x 3 bouts; Supine R hip medial to lateral mobilizations, grade II, 30s/bout x 3 bouts; Hooklying lumbar rotation rocking x 60s; Hooklying alternating hip flexion marching x 10 BLE;   PATIENT EDUCATION:  Education details: Pt educated throughout session about proper posture and technique with exercises. Improved exercise technique, movement at target joints, use of target muscles after min to mod verbal, visual, tactile cues, HEP modifications Person educated: Patient Education method: Explanation and handout Education comprehension: verbalized understanding   HOME EXERCISE PROGRAM: Access Code: [email or web address] URL: [email or web address] Date: 10/30/2021 Prepared by: Roxana Hires  Exercises - Supine March  - 1-2 x daily - 7 x weekly - 2 sets - 10 reps - 2-3s hold - Supine Lower Trunk Rotation  - 1-2 x daily - 7 x weekly - 2 sets - 10 reps - 2-3s hold - Hooklying Clamshell with Resistance  - 1-2 x daily - 7 x weekly - 2  sets - 10 reps - 2-3s hold - Supine Hip Adduction Isometric with Ball  - 1-2 x daily - 7 x weekly - 2 sets - 10 reps - 2-3s hold   ASSESSMENT:  CLINICAL IMPRESSION: Focused on pain-free strengthening for RLE during session today with focus on R quad in order to stabilize knee. Also utilized soft tissue mobilization. Applied Biofreeze and pt reports relief at the end of the session. HEP updated during session and pt encouraged to follow-up as scheduled. Will continue to progress RLE strengthening at follow-up sessions. Patient will benefit from skilled PT to address above impairments and improve overall function.  REHAB POTENTIAL: Fair    CLINICAL DECISION MAKING: Evolving/moderate complexity  EVALUATION COMPLEXITY: Moderate   GOALS: Goals reviewed with patient? No  SHORT TERM GOALS: Target date: 11/17/2021  Pt will be independent with HEP to improve strength and decrease back pain to improve pain-free function at home and work. Baseline:  Goal status: INITIAL   LONG TERM GOALS: Target date: 12/15/2021  Pt will increase FOTO to at least 57 to demonstrate significant improvement in function at home and work related to back pain  Baseline: 10/20/21: 50;  Goal status: INITIAL  2.  Pt will decrease worst back pain by at least 2 points on the NPRS in order to demonstrate clinically significant reduction in back pain. Baseline: 10/20/21: worst: 3/10 Goal status: INITIAL  3.  Pt will decrease mODI score by at least 13 points in order demonstrate clinically significant reduction in back pain/disability.  Baseline: 10/20/21: To be completed Goal status: INITIAL   PLAN: PT FREQUENCY: 2x/week  PT DURATION: 8 weeks  PLANNED INTERVENTIONS: Therapeutic exercises, Therapeutic activity, Neuromuscular re-education, Balance training, Gait training, Patient/Family education, Joint manipulation, Joint mobilization, Canalith repositioning, Aquatic Therapy, Dry Needling, Cognitive  remediation, Electrical stimulation, Spinal manipulation, Spinal mobilization, Cryotherapy, Moist heat, Traction, Ultrasound, Ionotophoresis '4mg'$ /ml Dexamethasone, and Manual therapy  PLAN FOR NEXT SESSION: mODI, review/modify HEP as needed, continue strengthening, manual techniques as needed for pain control   Lyndel Safe Zierra Laroque PT, DPT, GCS  Cosmo Tetreault 11/06/2021, 9:46 AM

## 2021-11-06 ENCOUNTER — Ambulatory Visit: Payer: Medicare HMO

## 2021-11-06 DIAGNOSIS — M6281 Muscle weakness (generalized): Secondary | ICD-10-CM

## 2021-11-06 DIAGNOSIS — M5459 Other low back pain: Secondary | ICD-10-CM | POA: Diagnosis not present

## 2021-11-13 ENCOUNTER — Ambulatory Visit: Payer: Medicare HMO

## 2021-11-13 DIAGNOSIS — M6281 Muscle weakness (generalized): Secondary | ICD-10-CM

## 2021-11-13 DIAGNOSIS — M5459 Other low back pain: Secondary | ICD-10-CM | POA: Diagnosis not present

## 2021-11-13 NOTE — Therapy (Signed)
OUTPATIENT PHYSICAL THERAPY THORACOLUMBAR TREATMENT/DISCHARGE  Patient Name: Sherry Paul MRN: [id number] DOB:Dec 31, 1945, 76 y.o., female Today's Date: 11/14/2021   PT End of Session - 11/13/21 0823     Visit Number 5    Number of Visits 17    Date for PT Re-Evaluation 12/15/21    Authorization Type eval: 10/20/21    PT Start Time 0830    PT Stop Time 0915    PT Time Calculation (min) 45 min    Activity Tolerance Patient tolerated treatment well    Behavior During Therapy [facility] for tasks assessed/performed            Past Medical History:  Diagnosis Date   Cancer ([place])    thyroid ca   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[place])    Onychomycosis    Osteoporosis    Past Surgical History:  Procedure Laterality Date   CESAREAN SECTION     COLONOSCOPY WITH PROPOFOL N/A 10/04/2017   Procedure: COLONOSCOPY WITH PROPOFOL;  Surgeon: Manya Silvas, MD;  Location: [facility] ENDOSCOPY;  Service: Endoscopy;  Laterality: N/A;   ESOPHAGOGASTRODUODENOSCOPY N/A 06/25/2014   Procedure: ESOPHAGOGASTRODUODENOSCOPY (EGD);  Surgeon: Manya Silvas, MD;  Location: [facility] ENDOSCOPY;  Service: Endoscopy;  Laterality: N/A;   LAPAROSCOPIC BILATERAL SALPINGO OOPHERECTOMY Bilateral 04/12/2020   Procedure: LAPAROSCOPIC BILATERAL SALPINGO OOPHORECTOMY;  Surgeon: Schermerhorn, Gwen Her, MD;  Location: [facility] ORS;  Service: Gynecology;  Laterality: Bilateral;   monitoring cranial nerves bilateral     OOPHORECTOMY Bilateral    SAVORY DILATION N/A 06/25/2014   Procedure: SAVORY DILATION;  Surgeon: Manya Silvas, MD;  Location: [facility] ENDOSCOPY;  Service: Endoscopy;  Laterality: N/A;   TONSILLECTOMY     TOTAL THYROIDECTOMY     There are no problems to display for this patient.  PCP: Sallee Lange, NP  REFERRING PROVIDER: Sallee Lange, *  REFERRING DIAGNOSIS: M54.50 (ICD-10-CM) - Low back pain, unspecified, R29.898 (ICD-10-CM) - Other symptoms and signs involving the musculoskeletal system,  M62.830 (ICD-10-CM) - Muscle spasm of back  THERAPY DIAG: Other low back pain  Muscle weakness (generalized)  RATIONALE FOR EVALUATION AND TREATMENT: Rehabilitation  ONSET DATE: 09/17/21 (approximate)  FOLLOW UP APPT WITH PROVIDER: No   FROM INITIAL EVALUATION (10/20/21) SUBJECTIVE:                                                                                                                                                                                         Chief Complaint: R low back pain  Pertinent History Presents referred to physical therapy for acute lower back pain which started around 09/17/21 upon waking. No known trauma or precipitating event. Pain  was localized initially in the back but eventually started radiating down the RLE to the level of the knee. She was prescribed a steroid taper which helped considerably. "My back is feeling pretty good today." She reports approximately 85% improvement in her back symptoms since onset but continues with symptoms in the R low back. Pt has a history of L sided hemangioma that is "the size of my brainstem" and affects the strength in the right side of her body (BUE/BLE). She has been advised to wait for any surgical intervention until it causes severe mobility impairments because of the risks involved with the procedure. No prior history of prior significant back pain.  Weakness reported in R knee which is pre-existing. She denies numbness/tingling. She has tried heat, ice, and tylenol (doesn't take ibuprofen at the recommendation of her neurosurgeon) with minimal relief.   Pain:  Pain Intensity: Present: 0/10, Best: 0/10, Worst: 3/10 Pain location: Right low back into the R posterior hip Pain Quality:  pressure   Radiating: Yes down the lateral right thigh to the knee (history of knee pain previously) Numbness/Tingling: No Focal Weakness: Yes, R knee weakness which is pre-existing; Aggravating factors: bending forward, climbing stairs  (holds onto banister, more difficulty descending), extending standing Standing tolerance: 20 minutes; Relieving factors: prednisone, sitting 24-hour pain behavior: fluctuates History of prior back injury, pain, surgery, or therapy: No Falls: Has patient fallen in last 6 months? 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 Multilevel spondylolisthesis. Mild subarticular stenosis bilaterally L1-2. Mild spinal stenosis L2-3 with moderate subarticular stenosis bilaterally. Mild subarticular stenosis bilaterally L3-4. L5 is sacralized. R hip MRI 01/17/20: R hip WNL, R knee radiographs 07/14/21: Mild lateral deviation and tilt to the patella within the trochlea.  No fractures or dislocations.  No soft tissue swelling or joint effusion. There are moderate tricompartmental degenerative joint changes worse in the medial compartment.  The medial compartment has joint space narrowing, osteophyte formation, subchondral sclerosis.  No loose bodies. No abnormal bone lesions. 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 They live in [place], [place]. They have a daughter and grandchildren who live 2 hours west of them [facility]).  Hobbies: knit and crochet, read Red flags: Positive: Hx of thyroid cancer, Negative: bowel/bladder changes, saddle paresthesia, h/o spinal tumors, h/o compression fx, abdominal pain, chills/fever, night sweats, nausea, vomiting, unrelenting pain  Precautions: None  Weight Bearing Restrictions: No  Living Environment Lives with: lives alone Lives in: House/apartment, ground floor apartment (3 story apartment, 14 steps with handrail on the right during ascend to second level) Has following equipment at home: Single point cane  Patient Goals: Improve R low back pain and improve  RLE strength   OBJECTIVE:   Patient Surveys  FOTO 50, predicted improvement to 72  Cognition Patient is oriented to person, place, and time.  Recent memory is intact.  Remote memory is intact.  Attention span and concentration are intact.  Expressive speech is intact.  Patient's fund of knowledge is within normal limits for educational level.   Gross Musculoskeletal Assessment Tremor: None Bulk: Normal Tone: Normal No erythema/bruising, warmth or edema to low back. No visible step-off along spinal column;  GAIT: Deferred full gait assessment;  Posture: Lumbar lordosis: WNL Iliac crest height: Equal bilaterally Lumbar lateral shift: Negative  AROM  AROM (Normal range in degrees) AROM  11/14/2021  Lumbar   Flexion (65) Decreased lower lumbar segmental flexion  Extension (30) WNL  Right lateral flexion (25) Mild limitation  Left lateral flexion (25) Mild limitation  Right rotation (30) Mild limitation*  Left rotation (30) Mild limitation*      Hip Right Left  Flexion (125) WNL WNL  Extension (15)    Abduction (40)    Adduction     Internal Rotation (45)    External Rotation (45)        Knee    Flexion (135) WNL WNL  Extension (0) WNL WNL      Ankle    Dorsiflexion (20)    Plantarflexion (50)    Inversion (35)    Eversion (15    (* = pain; Blank rows = not tested)  LE MMT:  MMT (out of 5) Right 11/14/2021 Left 11/14/2021  Hip flexion 3+ 4+  Hip extension    Hip abduction    Hip adduction    Hip internal rotation 5 5  Hip external rotation 5 5  Knee flexion 4+ 5  Knee extension 5 5  Ankle dorsiflexion 4 5  Ankle plantarflexion    Ankle inversion    Ankle eversion    (* = pain; Blank rows = not tested)  Sensation Grossly intact to light touch bilateral LEs as determined by testing dermatomes L2-S2. Proprioception, and hot/cold testing deferred on this date.  Reflexes Deferred  Muscle Length Hamstrings: R: Not examined L: Not  examined Ely (quadriceps): R: Not examined L: Not examined Thomas (hip flexors): R: Not examined L: Not examined Ober: R: Not examined L: Not examined  Palpation  Location LEFT  RIGHT           Lumbar paraspinals 0 1  Quadratus Lumborum 0 0  Gluteus Maximus 0 1  Gluteus Medius    Deep hip external rotators    PSIS    Fortin's Area (SIJ)    Greater Trochanter    (Blank rows = not tested) Graded on 0-4 scale (0 = no pain, 1 = pain, 2 = pain with wincing/grimacing/flinching, 3 = pain with withdrawal, 4 = unwilling to allow palpation), (Blank rows = not tested)  Passive Accessory Intervertebral Motion Deferred  SPECIAL TESTS Lumbar Radiculopathy and Discogenic: Centralization and Peripheralization (SN 92, -LR 0.12): Not examined Slump (SN 83, -LR 0.32): R: Negative L: Negative SLR (SN 92, -LR 0.29): R: Negative L:  Negative Crossed SLR (SP 90): R: Negative L: Negative  Facet Joint: Extension-Rotation (SN 100, -LR 0.0): R: Negative L: Negative  Lumbar Foraminal Stenosis: Lumbar quadrant (SN 70): R: Negative L: Negative  Hip: FABER (SN 81): R: Negative L: Positive for back pain FADIR (SN 94): R: Negative L: Positive for back pain Hip scour (SN 50): R: Positive for back pain L: Positive for back pain  SIJ:  Thigh Thrust (SN 88, -LR 0.18) : R: Not examined L: Not examined  Piriformis Syndrome: FAIR Test (SN 88, SP 83): R: Not examined L: Not examined  Functional Tasks Deferred  Beighton scale: Deferred, pt denies history of hypermobility;   TODAY'S TREATMENT    SUBJECTIVE: Pt reports continued significant improvement in low back pain since starting with therapy. Her back pain is at least 90% improved. She has noticed significant increase in R knee pain recently. This has resulted in episodes of R knee buckling.   PAIN: R knee pain   Ther-ex  NuStep L1 BLE/BUE x 10:00 minutes during interval history; Supine R quad  set over towel 5s hold x 5, knee pain  increases; Supine R SLR to 30 degrees of hip flexion x 5, knee pain increases; Supine straight leg manually resisted hip abduction x 5, knee pain increases; Supine glut set x 5, knee pain increases; Hooklying clams with manual resistance x 5, knee pain increases; Hooklying adductor squeeze with manual resistance x 5, knee pain increases; Hooklying bridges x 5, knee pain increases; HEP discussed and modified (less reps per set) with patient;   Manual Therapy  STM with light effleurage to R quad, hamstring, and proximal calf to help modulate pain. Applied Biofreeze to R knee afterward;   Building control surveyor [employer] unit for IFC to R knee using two channel cross pattern (small pads) and default settings. Utilized pt tolerated intensity of 12.0 to produce a strong sensory stimulus but no muscle activation. Applied concurrent ice pack to R knee x 10 minutes. No redness or skin irritation once electrodes removed. Pt reports decrease in R knee pain afterwards    PATIENT EDUCATION:  Education details: Pt educated throughout session about proper posture and technique with exercises. Improved exercise technique, movement at target joints, use of target muscles after min to mod verbal, visual, tactile cues, R knee pain, follow-up with PCP regarding R knee pain Person educated: Patient Education method: Explanation Education comprehension: verbalized understanding   HOME EXERCISE PROGRAM: Access Code: [email or web address] URL: [email or web address] Date: 10/30/2021 Prepared by: Roxana Hires  Exercises - Supine March  - 1-2 x daily - 7 x weekly - 2 sets - 10 reps - 2-3s hold - Supine Lower Trunk Rotation  - 1-2 x daily - 7 x weekly - 2 sets - 10 reps - 2-3s hold - Hooklying Clamshell with Resistance  - 1-2 x daily - 7 x weekly - 2 sets - 10 reps - 2-3s hold - Supine Hip Adduction Isometric with Ball  - 1-2 x daily - 7 x weekly - 2 sets - 10 reps - 2-3s  hold   ASSESSMENT:  CLINICAL IMPRESSION: Pt reporting at least 90% improvement in low back pain since starting with therapy. Unfortunately she is experiencing severe R knee pain which is new. No bruising and only mild swelling noted in R knee. Mild warmth but no redness or signs of infection. Attempted RLE strengthening during session today however pt reports significant increase in R knee pain with all low level exercises. Utilized electrical stimulation as well as ice and soft tissue massage to assist with pain. Applied Biofreeze and pt reports relief in R knee pain at the end of the session. Pt encouraged to contact PCP regarding new R knee pain.  Shared decision was made to end therapy at this time due to improvement in back pain and limitations related to knee. At this time pt is unable to participate in therapy for her R knee pain due to the level of pain. Pt will call and return for additional conditioning/strengthening once R knee pain is better managed.    REHAB POTENTIAL: Fair    CLINICAL DECISION MAKING: Evolving/moderate complexity  EVALUATION COMPLEXITY: Moderate   GOALS: Goals reviewed with patient? No  SHORT TERM GOALS: Target date: 11/17/2021  Pt will be independent with HEP to improve strength and decrease back pain to improve pain-free function at home and work. Baseline:  Goal status: INITIAL   LONG TERM GOALS: Target date: 12/15/2021  Pt will increase FOTO to at least 57 to demonstrate significant improvement in function at home and work  related to back pain  Baseline: 10/20/21: 50;  Goal status: Deferred  2.  Pt will decrease worst back pain by at least 2 points on the NPRS in order to demonstrate clinically significant reduction in back pain. Baseline: 10/20/21: worst: 3/10; 11/13/21: 90% improvement in symptoms, worst pain not rated; Goal status: Partially met  3.  Pt will decrease mODI score by at least 13 points in order demonstrate clinically significant  reduction in back pain/disability.       Baseline: 10/20/21: To be completed Goal status: Deferred   PLAN: PT FREQUENCY: 2x/week  PT DURATION: 8 weeks  PLANNED INTERVENTIONS: Therapeutic exercises, Therapeutic activity, Neuromuscular re-education, Balance training, Gait training, Patient/Family education, Joint manipulation, Joint mobilization, Canalith repositioning, Aquatic Therapy, Dry Needling, Cognitive remediation, Electrical stimulation, Spinal manipulation, Spinal mobilization, Cryotherapy, Moist heat, Traction, Ultrasound, Ionotophoresis 12m/ml Dexamethasone, and Manual therapy  PLAN FOR NEXT SESSION: Discharge  JLyndel SafeHuprich PT, DPT, GCS  Mixtli Reno 11/14/2021, 9:50 AM

## 2022-04-12 DIAGNOSIS — M1711 Unilateral primary osteoarthritis, right knee: Secondary | ICD-10-CM | POA: Insufficient documentation

## 2022-04-30 ENCOUNTER — Other Ambulatory Visit: Payer: Medicare HMO

## 2022-05-01 ENCOUNTER — Other Ambulatory Visit: Payer: Self-pay

## 2022-05-01 ENCOUNTER — Encounter
Admission: RE | Admit: 2022-05-01 | Discharge: 2022-05-01 | Disposition: A | Discharge status: Home / Self Care | Payer: Medicare HMO | Source: Ambulatory Visit | Attending: Orthopedic Surgery | Admitting: Orthopedic Surgery

## 2022-05-01 DIAGNOSIS — R8290 Unspecified abnormal findings in urine: Secondary | ICD-10-CM | POA: Diagnosis not present

## 2022-05-01 DIAGNOSIS — M1711 Unilateral primary osteoarthritis, right knee: Secondary | ICD-10-CM | POA: Insufficient documentation

## 2022-05-01 DIAGNOSIS — R350 Frequency of micturition: Secondary | ICD-10-CM | POA: Diagnosis not present

## 2022-05-01 DIAGNOSIS — E119 Type 2 diabetes mellitus without complications: Secondary | ICD-10-CM | POA: Insufficient documentation

## 2022-05-01 DIAGNOSIS — Z01818 Encounter for other preprocedural examination: Secondary | ICD-10-CM

## 2022-05-01 DIAGNOSIS — Z01812 Encounter for preprocedural laboratory examination: Secondary | ICD-10-CM

## 2022-05-01 HISTORY — DX: Unspecified osteoarthritis, unspecified site: M19.90

## 2022-05-01 HISTORY — DX: Unspecified asthma, uncomplicated: J45.909

## 2022-05-01 HISTORY — DX: Hypothyroidism, unspecified: E03.9

## 2022-05-01 LAB — SURGICAL PCR SCREEN
MRSA, PCR: NEGATIVE
Staphylococcus aureus: NEGATIVE

## 2022-05-01 LAB — COMPREHENSIVE METABOLIC PANEL
ALT: 16 U/L (ref 0–44)
AST: 21 U/L (ref 15–41)
Albumin: 3.8 g/dL (ref 3.5–5.0)
Alkaline Phosphatase: 109 U/L (ref 38–126)
Anion gap: 11 (ref 5–15)
BUN: 16 mg/dL (ref 8–23)
CO2: 26 mmol/L (ref 22–32)
Calcium: 9.2 mg/dL (ref 8.9–10.3)
Chloride: 103 mmol/L (ref 98–111)
Creatinine, Ser: 0.71 mg/dL (ref 0.44–1.00)
GFR, Estimated: >60 mL/min (ref >60)
Glucose, Bld: 105 mg/dL — ABNORMAL HIGH (ref 70–99)
Potassium: 4.1 mmol/L (ref 3.5–5.1)
Sodium: 140 mmol/L (ref 135–145)
Total Bilirubin: 0.5 mg/dL (ref 0.3–1.2)
Total Protein: 7.1 g/dL (ref 6.5–8.1)

## 2022-05-01 LAB — CBC
HCT: 36.1 % (ref 36.0–46.0)
Hemoglobin: 11.4 g/dL — ABNORMAL LOW (ref 12.0–15.0)
MCH: 28.1 pg (ref 26.0–34.0)
MCHC: 31.6 g/dL (ref 30.0–36.0)
MCV: 89.1 fL (ref 80.0–100.0)
Platelets: 232 10*3/uL (ref 150–400)
RBC: 4.05 MIL/uL (ref 3.87–5.11)
RDW: 14.4 % (ref 11.5–15.5)
WBC: 9.3 10*3/uL (ref 4.0–10.5)
nRBC: 0 % (ref 0.0–0.2)

## 2022-05-01 LAB — URINALYSIS, ROUTINE W REFLEX MICROSCOPIC
Bilirubin Urine: NEGATIVE
Glucose, UA: NEGATIVE mg/dL
Hgb urine dipstick: NEGATIVE
Ketones, ur: NEGATIVE mg/dL
Nitrite: NEGATIVE
Protein, ur: NEGATIVE mg/dL
Specific Gravity, Urine: 1.005 (ref 1.005–1.030)
Squamous Epithelial / HPF: NONE SEEN /HPF (ref 0–5)
pH: 5 (ref 5.0–8.0)

## 2022-05-01 LAB — TYPE AND SCREEN
ABO/RH(D): B POS
Antibody Screen: NEGATIVE

## 2022-05-01 LAB — SEDIMENTATION RATE: Sed Rate: 66 mm/hr — ABNORMAL HIGH (ref 0–30)

## 2022-05-01 LAB — HEMOGLOBIN A1C
Hgb A1c MFr Bld: 6.3 % — ABNORMAL HIGH (ref 4.8–5.6)
Mean Plasma Glucose: 134.11 mg/dL

## 2022-05-01 LAB — C-REACTIVE PROTEIN: CRP: 0.9 mg/dL (ref <1.0)

## 2022-05-01 NOTE — Patient Instructions (Addendum)
Your procedure is scheduled on: 05/11/22 - Monday Report to the Registration Desk on the 1st floor of the [facility]. To find out your arrival time, please call [phone] between 1PM - 3PM on: 05/08/22 - Friday If your arrival time is 6:00 am, do not arrive before that time as the [facility] entrance doors do not open until 6:00 am.  REMEMBER: Instructions that are not followed completely may result in serious medical risk, up to and including death; or upon the discretion of your surgeon and anesthesiologist your surgery may need to be rescheduled.  Do not eat food after midnight the night before surgery.  No gum chewing or hard candies.  You may however, drink CLEAR liquids up to 2 hours before you are scheduled to arrive for your surgery. Do not drink anything within 2 hours of your scheduled arrival time.  Clear liquids include: - water   In addition, your doctor has ordered for you to drink the provided:  Gatorade G2 Drinking this carbohydrate drink up to two hours before surgery helps to reduce insulin resistance and improve patient outcomes. Please complete drinking 2 hours before scheduled arrival time.  One week prior to surgery: Stop Anti-inflammatories (NSAIDS) such as Advil, Aleve, Ibuprofen, Motrin, Naproxen, Naprosyn and Aspirin based products such as Excedrin, Goody's Powder, BC Powder.  Stop taking beginning 05/04/22 ANY OVER THE COUNTER supplements until after surgery.  You may take Tylenol if needed for pain up until the day of surgery.  TAKE ONLY THESE MEDICATIONS THE MORNING OF SURGERY WITH A SIP OF WATER:  levothyroxine (SYNTHROID, LEVOTHROID)    No Alcohol for 24 hours before or after surgery.  No Smoking including e-cigarettes for 24 hours before surgery.  No chewable tobacco products for at least 6 hours before surgery.  No nicotine patches on the day of surgery.  Do not use any "recreational" drugs for at least a week (preferably 2 weeks) before  your surgery.  Please be advised that the combination of cocaine and anesthesia may have negative outcomes, up to and including death. If you test positive for cocaine, your surgery will be cancelled.  On the morning of surgery brush your teeth with toothpaste and water, you may rinse your mouth with mouthwash if you wish. Do not swallow any toothpaste or mouthwash.  Use CHG Soap or wipes as directed on instruction sheet.  Do not wear jewelry, make-up, hairpins, clips or nail polish.  Do not wear lotions, powders, or perfumes.   Do not shave body hair from the neck down 48 hours before surgery.  Contact lenses, hearing aids and dentures may not be worn into surgery.  Do not bring valuables to the hospital. [facility] is not responsible for any missing/lost belongings or valuables.   Notify your doctor if there is any change in your medical condition (cold, fever, infection).  Wear comfortable clothing (specific to your surgery type) to the hospital.  After surgery, you can help prevent lung complications by doing breathing exercises.  Take deep breaths and cough every 1-2 hours. Your doctor may order a device called an Incentive Spirometer to help you take deep breaths. When coughing or sneezing, hold a pillow firmly against your incision with both hands. This is called "splinting." Doing this helps protect your incision. It also decreases belly discomfort.  If you are being admitted to the hospital overnight, leave your suitcase in the car. After surgery it may be brought to your room.  In case of increased  patient census, it may be necessary for you, the patient, to continue your postoperative care in the Same Day Surgery department.  If you are being discharged the day of surgery, you will not be allowed to drive home. You will need a responsible individual to drive you home and stay with you for 24 hours after surgery.   If you are taking public transportation, you will need to  have a responsible individual with you.  Please call the Pre-admissions Testing Dept. at [phone] if you have any questions about these instructions.  Surgery Visitation Policy:  Patients having surgery or a procedure may have two visitors.  Children under the age of 23 must have an adult with them who is not the patient.  Inpatient Visitation:    Visiting hours are 7 a.m. to 8 p.m. Up to four visitors are allowed at one time in a patient room. The visitors may rotate out with other people during the day.  One visitor age 21 or older may stay with the patient overnight and must be in the room by 8 p.m. How to Use an Incentive Spirometer  An incentive spirometer is a tool that measures how well you are filling your lungs with each breath. Learning to take long, deep breaths using this tool can help you keep your lungs clear and active. This may help to reverse or lessen your chance of developing breathing (pulmonary) problems, especially infection. You may be asked to use a spirometer: After a surgery. If you have a lung problem or a history of smoking. After a long period of time when you have been unable to move or be active. If the spirometer includes an indicator to show the highest number that you have reached, your health care provider or respiratory therapist will help you set a goal. Keep a log of your progress as told by your health care provider. What are the risks? Breathing too quickly may cause dizziness or cause you to pass out. Take your time so you do not get dizzy or light-headed. If you are in pain, you may need to take pain medicine before doing incentive spirometry. It is harder to take a deep breath if you are having pain. How to use your incentive spirometer  Sit up on the edge of your bed or on a chair. Hold the incentive spirometer so that it is in an upright position. Before you use the spirometer, breathe out normally. Place the mouthpiece in your mouth.  Make sure your lips are closed tightly around it. Breathe in slowly and as deeply as you can through your mouth, causing the piston or the ball to rise toward the top of the chamber. Hold your breath for 3-5 seconds, or for as long as possible. If the spirometer includes a coach indicator, use this to guide you in breathing. Slow down your breathing if the indicator goes above the marked areas. Remove the mouthpiece from your mouth and breathe out normally. The piston or ball will return to the bottom of the chamber. Rest for a few seconds, then repeat the steps 10 or more times. Take your time and take a few normal breaths between deep breaths so that you do not get dizzy or light-headed. Do this every 1-2 hours when you are awake. If the spirometer includes a goal marker to show the highest number you have reached (best effort), use this as a goal to work toward during each repetition. After each set of 10 deep  breaths, cough a few times. This will help to make sure that your lungs are clear. If you have an incision on your chest or abdomen from surgery, place a pillow or a rolled-up towel firmly against the incision when you cough. This can help to reduce pain while taking deep breaths and coughing. General tips When you are able to get out of bed: Walk around often. Continue to take deep breaths and cough in order to clear your lungs. Keep using the incentive spirometer until your health care provider says it is okay to stop using it. If you have been in the hospital, you may be told to keep using the spirometer at home. Contact a health care provider if: You are having difficulty using the spirometer. You have trouble using the spirometer as often as instructed. Your pain medicine is not giving enough relief for you to use the spirometer as told. You have a fever. Get help right away if: You develop shortness of breath. You develop a cough with bloody mucus from the lungs. You have fluid  or blood coming from an incision site after you cough. Summary An incentive spirometer is a tool that can help you learn to take long, deep breaths to keep your lungs clear and active. You may be asked to use a spirometer after a surgery, if you have a lung problem or a history of smoking, or if you have been inactive for a long period of time. Use your incentive spirometer as instructed every 1-2 hours while you are awake. If you have an incision on your chest or abdomen, place a pillow or a rolled-up towel firmly against your incision when you cough. This will help to reduce pain. Get help right away if you have shortness of breath, you cough up bloody mucus, or blood comes from your incision when you cough. This information is not intended to replace advice given to you by your health care provider. Make sure you discuss any questions you have with your health care provider. Document Revised: 03/27/2019 Document Reviewed: 03/27/2019 Elsevier Patient Education  2023 Elsevier Inc.   POLAR CARE INFORMATION  MassAdvertisement.it  How to use [facility] Therapy System?  YouTube   ShippingScam.co.uk  OPERATING INSTRUCTIONS  Start the product With dry hands, connect the transformer to the electrical connection located on the top of the cooler. Next, plug the transformer into an appropriate electrical outlet. The unit will automatically start running at this point.  To stop the pump, disconnect electrical power.  Unplug to stop the product when not in use. Unplugging the Polar Care unit turns it off. Always unplug immediately after use. Never leave it plugged in while unattended. Remove pad.    FIRST ADD WATER TO FILL LINE, THEN ICE---Replace ice when existing ice is almost melted  1 Discuss Treatment with your Licensed Health Care Practitioner and Use Only as Prescribed 2 Apply Insulation Barrier & Cold Therapy Pad 3 Check for Moisture 4 Inspect Skin  Regularly  Tips and Trouble Shooting Usage Tips 1. Use cubed or chunked ice for optimal performance. 2. It is recommended to drain the Pad between uses. To drain the pad, hold the Pad upright with the hose pointed toward the ground. Depress the black plunger and allow water to drain out. 3. You may disconnect the Pad from the unit without removing the pad from the affected area by depressing the silver tabs on the hose coupling and gently pulling the hoses apart. The  Pad and unit will seal itself and will not leak. Note: Some dripping during release is normal. 4. DO NOT RUN PUMP WITHOUT WATER! The pump in this unit is designed to run with water. Running the unit without water will cause permanent damage to the pump. 5. Unplug unit before removing lid.  TROUBLESHOOTING GUIDE Pump not running, Water not flowing to the pad, Pad is not getting cold 1. Make sure the transformer is plugged into the wall outlet. 2. Confirm that the ice and water are filled to the indicated levels. 3. Make sure there are no kinks in the pad. 4. Gently pull on the blue tube to make sure the tube/pad junction is straight. 5. Remove the pad from the treatment site and ll it while the pad is lying at; then reapply. 6. Confirm that the pad couplings are securely attached to the unit. Listen for the double clicks (Figure 1) to confirm the pad couplings are securely attached.  Leaks    Note: Some condensation on the lines, controller, and pads is unavoidable, especially in warmer climates. 1. If using a [employer] Polar Care Cold Therapy unit with a detachable Cold Therapy Pad, and a leak exists (other than condensation on the lines) disconnect the pad couplings. Make sure the silver tabs on the couplings are depressed before reconnecting the pad to the pump hose; then confirm both sides of the coupling are properly clicked in. 2. If the coupling continues to leak or a leak is detected in the pad itself, stop using it and call [employer]  Customer Care at [phone].  Cleaning After use, empty and dry the unit with a soft cloth. Warm water and mild detergent may be used occasionally to clean the pump and tubes.  WARNING: The Polar Care Cube can be cold enough to cause serious injury, including full skin necrosis. Follow these Operating Instructions, and carefully read the Product Insert (see pouch on side of unit) and the Cold Therapy Pad Fitting Instructions (provided with each Cold Therapy Pad) prior to use.

## 2022-05-02 NOTE — Discharge Instructions (Addendum)
Instructions after Total Knee Replacement   James P. Angie Fava., M.D.     Dept. of Orthopaedics & Sports Medicine  [facility]  [address]  [place], [place]  [address]  Phone: [phone]   Fax: [phone]    DIET: Drink plenty of non-alcoholic fluids. Resume your normal diet. Include foods high in fiber.  ACTIVITY:  You may use crutches or a walker with weight-bearing as tolerated, unless instructed otherwise. You may be weaned off of the walker or crutches by your Physical Therapist.  Do NOT place pillows under the knee. Anything placed under the knee could limit your ability to straighten the knee.   Continue doing gentle exercises. Exercising will reduce the pain and swelling, increase motion, and prevent muscle weakness.   Please continue to use the TED compression stockings for 6 weeks. You may remove the stockings at night, but should reapply them in the morning. Do not drive or operate any equipment until instructed.  WOUND CARE:  Continue to use the PolarCare or ice packs periodically to reduce pain and swelling. You may bathe or shower after the staples are removed at the first office visit following surgery. The current bandage needs to stay in place for 6 days.  They can then be changed by physical therapy to a new honeycomb bandage.  MEDICATIONS: You may resume your regular medications. Please take the pain medication as prescribed on the medication. Do not take pain medication on an empty stomach. You have been given a prescription for a blood thinner (Lovenox or Coumadin). Please take the medication as instructed. (NOTE: After completing a 2 week course of Lovenox, take one Enteric-coated aspirin twice a day. This along with elevation will help reduce the possibility of phlebitis in your operated leg.) Do not drive or drink alcoholic beverages when taking pain medications.  CALL THE OFFICE FOR: Temperature above 101 degrees Excessive bleeding or  drainage on the dressing. Excessive swelling, coldness, or paleness of the toes. Persistent nausea and vomiting.  FOLLOW-UP:  You should have an appointment to return to the office in 10-14 days after surgery. Arrangements have been made for continuation of Physical Therapy (either home therapy or outpatient therapy).     [facility] Department Directory         [email or web address]       [email or web address]          Cardiology  Appointments: [place] - [phone]  Endocrinology  Appointments: [place] [phone] [place] - [phone]  Gastroenterology  Appointments: [place] [phone] [place] - [phone]        General Surgery   Appointments: [facility]  Internal Medicine/Family Medicine  Appointments: [facility] [place] - [phone] [place] - [phone]  Metabolic and Weigh Loss Surgery  Appointments: [facility]        Neurology  Appointments: [place] [phone] [place] - [phone]  Neurosurgery  Appointments: [place]  Obstetrics & Gynecology  Appointments: [place] [phone] [place] - [phone]        Pediatrics  Appointments: [place] [phone] [place] - [phone]  Physiatry  Appointments: [place] [phone]  Physical Therapy  Appointments: [place] - [phone]        Podiatry  Appointments: [place] [phone] [place] - [phone]  Pulmonology  Appointments: [place]  Rheumatology  Appointments: [place] [phone]        [place] Location: [facility]  [address] [place], [place]  [address]  [place]  Location: [facility]. [address] [place], [place]  [address]  [place] Location: [facility] [address] [place], [place]  [address]

## 2022-05-03 LAB — URINE CULTURE: Culture: NO GROWTH

## 2022-05-11 ENCOUNTER — Other Ambulatory Visit: Payer: Self-pay

## 2022-05-11 ENCOUNTER — Ambulatory Visit: Payer: Medicare HMO | Admitting: Anesthesiology

## 2022-05-11 ENCOUNTER — Ambulatory Visit: Payer: Medicare HMO | Admitting: Urgent Care

## 2022-05-11 ENCOUNTER — Observation Stay: Payer: Medicare HMO

## 2022-05-11 ENCOUNTER — Observation Stay
Admission: RE | Admit: 2022-05-11 | Discharge: 2022-05-12 | Disposition: A | Discharge status: Home Health | Payer: Medicare HMO | Attending: Orthopedic Surgery | Admitting: Orthopedic Surgery

## 2022-05-11 ENCOUNTER — Encounter: Payer: Self-pay | Admitting: Orthopedic Surgery

## 2022-05-11 ENCOUNTER — Encounter
Admission: RE | Disposition: A | Discharge status: Home Health | Payer: Self-pay | Source: Home / Self Care | Attending: Orthopedic Surgery

## 2022-05-11 DIAGNOSIS — E039 Hypothyroidism, unspecified: Secondary | ICD-10-CM | POA: Diagnosis not present

## 2022-05-11 DIAGNOSIS — Z87891 Personal history of nicotine dependence: Secondary | ICD-10-CM | POA: Insufficient documentation

## 2022-05-11 DIAGNOSIS — E119 Type 2 diabetes mellitus without complications: Secondary | ICD-10-CM | POA: Insufficient documentation

## 2022-05-11 DIAGNOSIS — Z85850 Personal history of malignant neoplasm of thyroid: Secondary | ICD-10-CM | POA: Diagnosis not present

## 2022-05-11 DIAGNOSIS — J45909 Unspecified asthma, uncomplicated: Secondary | ICD-10-CM | POA: Insufficient documentation

## 2022-05-11 DIAGNOSIS — Z79899 Other long term (current) drug therapy: Secondary | ICD-10-CM | POA: Diagnosis not present

## 2022-05-11 DIAGNOSIS — G4730 Sleep apnea, unspecified: Secondary | ICD-10-CM | POA: Insufficient documentation

## 2022-05-11 DIAGNOSIS — M1711 Unilateral primary osteoarthritis, right knee: Secondary | ICD-10-CM | POA: Diagnosis not present

## 2022-05-11 DIAGNOSIS — E785 Hyperlipidemia, unspecified: Secondary | ICD-10-CM | POA: Insufficient documentation

## 2022-05-11 DIAGNOSIS — Q283 Other malformations of cerebral vessels: Secondary | ICD-10-CM | POA: Insufficient documentation

## 2022-05-11 DIAGNOSIS — Z96659 Presence of unspecified artificial knee joint: Secondary | ICD-10-CM

## 2022-05-11 HISTORY — PX: KNEE ARTHROPLASTY: SHX992

## 2022-05-11 LAB — GLUCOSE, CAPILLARY: Glucose-Capillary: 139 mg/dL — ABNORMAL HIGH (ref 70–99)

## 2022-05-11 SURGERY — ARTHROPLASTY, KNEE, TOTAL, USING IMAGELESS COMPUTER-ASSISTED NAVIGATION
Anesthesia: General | Site: Knee | Laterality: Right

## 2022-05-11 MED ORDER — OXYCODONE HCL 5 MG PO TABS: 10.0000 mg | ORAL_TABLET | ORAL | Status: DC | PRN

## 2022-05-11 MED ORDER — OXYCODONE HCL 5 MG PO TABS
5.0000 mg | ORAL_TABLET | ORAL | Status: DC | PRN
  Administered 2022-05-11 – 2022-05-12 (×2): 5 mg via ORAL

## 2022-05-11 MED ORDER — PHENYLEPHRINE HCL-NACL 20-0.9 MG/250ML-% IV SOLN
INTRAVENOUS | Status: DC | PRN
  Administered 2022-05-11: 30 ug/min via INTRAVENOUS

## 2022-05-11 MED ORDER — OXYCODONE HCL 5 MG/5ML PO SOLN: 5.0000 mg | Freq: Once | ORAL | Status: DC | PRN

## 2022-05-11 MED ORDER — ACETAMINOPHEN 10 MG/ML IV SOLN
1000.0000 mg | Freq: Four times a day (QID) | INTRAVENOUS | Status: DC
  Administered 2022-05-11 – 2022-05-12 (×2): 1000 mg via INTRAVENOUS

## 2022-05-11 MED ORDER — SUGAMMADEX SODIUM 200 MG/2ML IV SOLN
INTRAVENOUS | Status: DC | PRN
  Administered 2022-05-11: 200 mg via INTRAVENOUS

## 2022-05-11 MED ORDER — TRANEXAMIC ACID-NACL 1000-0.7 MG/100ML-% IV SOLN
1000.0000 mg | INTRAVENOUS | Status: AC
  Administered 2022-05-11: 1000 mg via INTRAVENOUS

## 2022-05-11 MED ORDER — LIDOCAINE HCL (CARDIAC) PF 100 MG/5ML IV SOSY
PREFILLED_SYRINGE | INTRAVENOUS | Status: DC | PRN
  Administered 2022-05-11: 100 mg via INTRAVENOUS

## 2022-05-11 MED ORDER — ACETAMINOPHEN 10 MG/ML IV SOLN
INTRAVENOUS | Status: AC
  Filled 2022-05-11: qty 100

## 2022-05-11 MED ORDER — HYDROMORPHONE HCL 1 MG/ML IJ SOLN: 0.5000 mg | INTRAMUSCULAR | Status: DC | PRN

## 2022-05-11 MED ORDER — OXYCODONE HCL 5 MG PO TABS: 5.0000 mg | ORAL_TABLET | Freq: Once | ORAL | Status: DC | PRN

## 2022-05-11 MED ORDER — ALUM & MAG HYDROXIDE-SIMETH 200-200-20 MG/5ML PO SUSP: 30.0000 mL | ORAL | Status: DC | PRN

## 2022-05-11 MED ORDER — CEFAZOLIN SODIUM-DEXTROSE 2-4 GM/100ML-% IV SOLN
2.0000 g | INTRAVENOUS | Status: AC
  Administered 2022-05-11: 2 g via INTRAVENOUS

## 2022-05-11 MED ORDER — SODIUM CHLORIDE 0.9 % IV SOLN: INTRAVENOUS | Status: DC

## 2022-05-11 MED ORDER — SODIUM CHLORIDE 0.9 % IR SOLN
Status: DC | PRN
  Administered 2022-05-11: 3000 mL

## 2022-05-11 MED ORDER — MAGNESIUM HYDROXIDE 400 MG/5ML PO SUSP: 30.0000 mL | Freq: Every day | ORAL | Status: DC

## 2022-05-11 MED ORDER — FAMOTIDINE 20 MG PO TABS
ORAL_TABLET | ORAL | Status: AC
  Filled 2022-05-11: qty 1

## 2022-05-11 MED ORDER — BISACODYL 10 MG RE SUPP: 10.0000 mg | Freq: Every day | RECTAL | Status: DC | PRN

## 2022-05-11 MED ORDER — SODIUM CHLORIDE 0.9 % IV SOLN
INTRAVENOUS | Status: DC | PRN
  Administered 2022-05-11: 60 mL

## 2022-05-11 MED ORDER — OXYCODONE HCL 5 MG PO TABS
ORAL_TABLET | ORAL | Status: AC
  Filled 2022-05-11: qty 1

## 2022-05-11 MED ORDER — PROPOFOL 10 MG/ML IV BOLUS
INTRAVENOUS | Status: DC | PRN
  Administered 2022-05-11: 150 mg via INTRAVENOUS

## 2022-05-11 MED ORDER — MENTHOL 3 MG MT LOZG: 1.0000 | LOZENGE | OROMUCOSAL | Status: DC | PRN

## 2022-05-11 MED ORDER — TRANEXAMIC ACID-NACL 1000-0.7 MG/100ML-% IV SOLN
INTRAVENOUS | Status: AC
  Filled 2022-05-11: qty 100

## 2022-05-11 MED ORDER — DEXAMETHASONE SODIUM PHOSPHATE 10 MG/ML IJ SOLN
8.0000 mg | Freq: Once | INTRAMUSCULAR | Status: AC
  Administered 2022-05-11: 8 mg via INTRAVENOUS

## 2022-05-11 MED ORDER — CEFAZOLIN SODIUM-DEXTROSE 2-4 GM/100ML-% IV SOLN
2.0000 g | Freq: Four times a day (QID) | INTRAVENOUS | Status: AC
  Administered 2022-05-11 (×2): 2 g via INTRAVENOUS

## 2022-05-11 MED ORDER — LEVOTHYROXINE SODIUM 75 MCG PO TABS
75.0000 ug | ORAL_TABLET | ORAL | Status: DC
  Filled 2022-05-11: qty 1

## 2022-05-11 MED ORDER — TRAMADOL HCL 50 MG PO TABS: 50.0000 mg | ORAL_TABLET | ORAL | Status: DC | PRN

## 2022-05-11 MED ORDER — PANTOPRAZOLE SODIUM 40 MG PO TBEC
DELAYED_RELEASE_TABLET | ORAL | Status: AC
  Filled 2022-05-11: qty 1

## 2022-05-11 MED ORDER — ONDANSETRON HCL 4 MG/2ML IJ SOLN
INTRAMUSCULAR | Status: DC | PRN
  Administered 2022-05-11: 4 mg via INTRAVENOUS

## 2022-05-11 MED ORDER — BUPIVACAINE HCL (PF) 0.25 % IJ SOLN
INTRAMUSCULAR | Status: DC | PRN
  Administered 2022-05-11: 60 mL

## 2022-05-11 MED ORDER — PANTOPRAZOLE SODIUM 40 MG PO TBEC
40.0000 mg | DELAYED_RELEASE_TABLET | Freq: Two times a day (BID) | ORAL | Status: DC
  Administered 2022-05-11 – 2022-05-12 (×2): 40 mg via ORAL

## 2022-05-11 MED ORDER — ASPIRIN 81 MG PO CHEW
81.0000 mg | CHEWABLE_TABLET | Freq: Two times a day (BID) | ORAL | Status: DC
  Administered 2022-05-12: 81 mg via ORAL

## 2022-05-11 MED ORDER — FIBER 500 MG PO CAPS: 6.0000 | ORAL_CAPSULE | Freq: Every day | ORAL | Status: DC

## 2022-05-11 MED ORDER — FLEET ENEMA 7-19 GM/118ML RE ENEM: 1.0000 | ENEMA | Freq: Once | RECTAL | Status: DC | PRN

## 2022-05-11 MED ORDER — PHENOL 1.4 % MT LIQD: 1.0000 | OROMUCOSAL | Status: DC | PRN

## 2022-05-11 MED ORDER — FENTANYL CITRATE (PF) 100 MCG/2ML IJ SOLN
INTRAMUSCULAR | Status: AC
  Filled 2022-05-11: qty 2

## 2022-05-11 MED ORDER — ONDANSETRON HCL 4 MG PO TABS: 4.0000 mg | ORAL_TABLET | Freq: Four times a day (QID) | ORAL | Status: DC | PRN

## 2022-05-11 MED ORDER — METOCLOPRAMIDE HCL 10 MG PO TABS
10.0000 mg | ORAL_TABLET | Freq: Three times a day (TID) | ORAL | Status: DC
  Administered 2022-05-11: 10 mg via ORAL

## 2022-05-11 MED ORDER — LORATADINE 10 MG PO TABS: 10.0000 mg | ORAL_TABLET | Freq: Every day | ORAL | Status: DC | PRN

## 2022-05-11 MED ORDER — MAGNESIUM HYDROXIDE 400 MG/5ML PO SUSP
ORAL | Status: AC
  Filled 2022-05-11: qty 30

## 2022-05-11 MED ORDER — LIDOCAINE HCL (PF) 2 % IJ SOLN
INTRAMUSCULAR | Status: AC
  Filled 2022-05-11: qty 5

## 2022-05-11 MED ORDER — ONDANSETRON HCL 4 MG/2ML IJ SOLN
INTRAMUSCULAR | Status: AC
  Filled 2022-05-11: qty 2

## 2022-05-11 MED ORDER — CHLORHEXIDINE GLUCONATE 4 % EX LIQD
60.0000 mL | Freq: Once | CUTANEOUS | Status: AC
  Administered 2022-05-11: 4 via TOPICAL
  Filled 2022-05-11: qty 60

## 2022-05-11 MED ORDER — MIDAZOLAM HCL 2 MG/2ML IJ SOLN
INTRAMUSCULAR | Status: AC
  Filled 2022-05-11: qty 2

## 2022-05-11 MED ORDER — ROCURONIUM BROMIDE 10 MG/ML (PF) SYRINGE
PREFILLED_SYRINGE | INTRAVENOUS | Status: AC
  Filled 2022-05-11: qty 10

## 2022-05-11 MED ORDER — PRAVASTATIN SODIUM 40 MG PO TABS
ORAL_TABLET | ORAL | Status: AC
  Filled 2022-05-11: qty 1

## 2022-05-11 MED ORDER — LEVOTHYROXINE SODIUM 150 MCG PO TABS
150.0000 ug | ORAL_TABLET | ORAL | Status: DC
  Administered 2022-05-12: 150 ug via ORAL
  Filled 2022-05-11: qty 1

## 2022-05-11 MED ORDER — GABAPENTIN 300 MG PO CAPS
ORAL_CAPSULE | ORAL | Status: AC
  Filled 2022-05-11: qty 1

## 2022-05-11 MED ORDER — DEXAMETHASONE SODIUM PHOSPHATE 10 MG/ML IJ SOLN
INTRAMUSCULAR | Status: AC
  Filled 2022-05-11: qty 1

## 2022-05-11 MED ORDER — FENTANYL CITRATE (PF) 100 MCG/2ML IJ SOLN
INTRAMUSCULAR | Status: DC | PRN
  Administered 2022-05-11 (×4): 50 ug via INTRAVENOUS

## 2022-05-11 MED ORDER — RISAQUAD PO CAPS
1.0000 | ORAL_CAPSULE | Freq: Two times a day (BID) | ORAL | Status: DC
  Administered 2022-05-11 – 2022-05-12 (×2): 1 via ORAL
  Filled 2022-05-11 (×2): qty 1

## 2022-05-11 MED ORDER — 0.9 % SODIUM CHLORIDE (POUR BTL) OPTIME
TOPICAL | Status: DC | PRN
  Administered 2022-05-11: 500 mL

## 2022-05-11 MED ORDER — SUCCINYLCHOLINE CHLORIDE 200 MG/10ML IV SOSY: PREFILLED_SYRINGE | INTRAVENOUS | Status: DC | PRN

## 2022-05-11 MED ORDER — CHLORHEXIDINE GLUCONATE 0.12 % MT SOLN
OROMUCOSAL | Status: AC
  Filled 2022-05-11: qty 15

## 2022-05-11 MED ORDER — ORAL CARE MOUTH RINSE: 15.0000 mL | OROMUCOSAL | Status: DC | PRN

## 2022-05-11 MED ORDER — PROPOFOL 10 MG/ML IV BOLUS
INTRAVENOUS | Status: AC
  Filled 2022-05-11: qty 20

## 2022-05-11 MED ORDER — GABAPENTIN 300 MG PO CAPS
300.0000 mg | ORAL_CAPSULE | Freq: Once | ORAL | Status: AC
  Administered 2022-05-11: 300 mg via ORAL

## 2022-05-11 MED ORDER — SURGIRINSE WOUND IRRIGATION SYSTEM - OPTIME
TOPICAL | Status: DC | PRN
  Administered 2022-05-11: 450 mL via TOPICAL

## 2022-05-11 MED ORDER — SODIUM CHLORIDE 0.9 % IV SOLN: INTRAVENOUS | Status: DC | PRN

## 2022-05-11 MED ORDER — DIPHENHYDRAMINE HCL 12.5 MG/5ML PO ELIX: 12.5000 mg | ORAL_SOLUTION | ORAL | Status: DC | PRN

## 2022-05-11 MED ORDER — PHENYLEPHRINE HCL (PRESSORS) 10 MG/ML IV SOLN
INTRAVENOUS | Status: DC | PRN
  Administered 2022-05-11 (×2): 100 ug via INTRAVENOUS

## 2022-05-11 MED ORDER — ROCURONIUM BROMIDE 100 MG/10ML IV SOLN
INTRAVENOUS | Status: DC | PRN
  Administered 2022-05-11: 20 mg via INTRAVENOUS
  Administered 2022-05-11: 50 mg via INTRAVENOUS

## 2022-05-11 MED ORDER — KETAMINE HCL 50 MG/5ML IJ SOSY
PREFILLED_SYRINGE | INTRAMUSCULAR | Status: AC
  Filled 2022-05-11: qty 5

## 2022-05-11 MED ORDER — KETAMINE HCL 10 MG/ML IJ SOLN
INTRAMUSCULAR | Status: DC | PRN
  Administered 2022-05-11: 50 mg via INTRAVENOUS

## 2022-05-11 MED ORDER — SENNOSIDES-DOCUSATE SODIUM 8.6-50 MG PO TABS
1.0000 | ORAL_TABLET | Freq: Two times a day (BID) | ORAL | Status: DC
  Administered 2022-05-11 – 2022-05-12 (×2): 1 via ORAL

## 2022-05-11 MED ORDER — CEFAZOLIN SODIUM-DEXTROSE 2-4 GM/100ML-% IV SOLN
INTRAVENOUS | Status: AC
  Filled 2022-05-11: qty 100

## 2022-05-11 MED ORDER — ONDANSETRON HCL 4 MG/2ML IJ SOLN: 4.0000 mg | Freq: Four times a day (QID) | INTRAMUSCULAR | Status: DC | PRN

## 2022-05-11 MED ORDER — TRANEXAMIC ACID-NACL 1000-0.7 MG/100ML-% IV SOLN
1000.0000 mg | Freq: Once | INTRAVENOUS | Status: AC
  Administered 2022-05-11: 1000 mg via INTRAVENOUS

## 2022-05-11 MED ORDER — DEXAMETHASONE SODIUM PHOSPHATE 10 MG/ML IJ SOLN
INTRAMUSCULAR | Status: DC | PRN
  Administered 2022-05-11: 10 mg via INTRAVENOUS

## 2022-05-11 MED ORDER — ACETAMINOPHEN 10 MG/ML IV SOLN
INTRAVENOUS | Status: DC | PRN
  Administered 2022-05-11: 1000 mg via INTRAVENOUS

## 2022-05-11 MED ORDER — METOCLOPRAMIDE HCL 10 MG PO TABS
ORAL_TABLET | ORAL | Status: AC
  Filled 2022-05-11: qty 1

## 2022-05-11 MED ORDER — ACETAMINOPHEN 325 MG PO TABS: 325.0000 mg | ORAL_TABLET | Freq: Four times a day (QID) | ORAL | Status: DC | PRN

## 2022-05-11 MED ORDER — MIDAZOLAM HCL 2 MG/2ML IJ SOLN
INTRAMUSCULAR | Status: DC | PRN
  Administered 2022-05-11: 2 mg via INTRAVENOUS

## 2022-05-11 MED ORDER — PRAVASTATIN SODIUM 40 MG PO TABS
40.0000 mg | ORAL_TABLET | Freq: Every day | ORAL | Status: DC
  Administered 2022-05-11: 40 mg via ORAL

## 2022-05-11 MED ORDER — SENNOSIDES-DOCUSATE SODIUM 8.6-50 MG PO TABS
ORAL_TABLET | ORAL | Status: AC
  Filled 2022-05-11: qty 1

## 2022-05-11 MED ORDER — LEVOTHYROXINE SODIUM 150 MCG PO TABS
150.0000 ug | ORAL_TABLET | Freq: Every day | ORAL | Status: DC
  Filled 2022-05-11: qty 1

## 2022-05-11 MED ORDER — SUCCINYLCHOLINE CHLORIDE 200 MG/10ML IV SOSY
PREFILLED_SYRINGE | INTRAVENOUS | Status: AC
  Filled 2022-05-11: qty 10

## 2022-05-11 MED ORDER — FENTANYL CITRATE (PF) 100 MCG/2ML IJ SOLN
25.0000 ug | INTRAMUSCULAR | Status: DC | PRN
  Administered 2022-05-11: 25 ug via INTRAVENOUS

## 2022-05-11 SURGICAL SUPPLY — 73 items
ATTUNE PSFEM RTSZ5 NARCEM KNEE (Femur) IMPLANT
ATTUNE PSRP INSR SZ5 5 KNEE (Insert) IMPLANT
BASEPLATE TIBIAL ROTATING SZ 4 (Knees) IMPLANT
BATTERY INSTRU NAVIGATION (MISCELLANEOUS) ×4 IMPLANT
BLADE SAW 70X12.5 (BLADE) ×1 IMPLANT
BLADE SAW 90X13X1.19 OSCILLAT (BLADE) ×1 IMPLANT
BLADE SAW 90X25X1.19 OSCILLAT (BLADE) ×1 IMPLANT
BONE CEMENT GENTAMICIN (Cement) ×2 IMPLANT
BSPLAT TIB 4 CMNT ROT PLAT STR (Knees) ×1 IMPLANT
BTRY SRG DRVR LF (MISCELLANEOUS) ×4
CEMENT BONE GENTAMICIN 40 (Cement) IMPLANT
COOLER POLAR GLACIER W/PUMP (MISCELLANEOUS) ×1 IMPLANT
CUFF TOURN SGL QUICK 24 (TOURNIQUET CUFF)
CUFF TOURN SGL QUICK 34 (TOURNIQUET CUFF)
CUFF TRNQT CYL 24X4X16.5-23 (TOURNIQUET CUFF) IMPLANT
CUFF TRNQT CYL 34X4.125X (TOURNIQUET CUFF) IMPLANT
DRAPE 3/4 80X56 (DRAPES) ×1 IMPLANT
DRAPE INCISE IOBAN 66X45 STRL (DRAPES) IMPLANT
DRSG AQUACEL AG ADV 3.5X14 (GAUZE/BANDAGES/DRESSINGS) ×1 IMPLANT
DRSG DERMACEA 8X12 NADH (GAUZE/BANDAGES/DRESSINGS) IMPLANT
DRSG DERMACEA NONADH 3X8 (GAUZE/BANDAGES/DRESSINGS) ×1 IMPLANT
DRSG MEPILEX SACRM 8.7X9.8 (GAUZE/BANDAGES/DRESSINGS) ×1 IMPLANT
DRSG TEGADERM 4X4.75 (GAUZE/BANDAGES/DRESSINGS) ×1 IMPLANT
DURAPREP 26ML APPLICATOR (WOUND CARE) ×2 IMPLANT
ELECT CAUTERY BLADE 6.4 (BLADE) ×1 IMPLANT
ELECT REM PT RETURN 9FT ADLT (ELECTROSURGICAL) ×1
ELECTRODE REM PT RTRN 9FT ADLT (ELECTROSURGICAL) ×1 IMPLANT
EX-PIN ORTHOLOCK NAV 4X150 (PIN) ×2 IMPLANT
GLOVE BIOGEL M STRL SZ7.5 (GLOVE) ×2 IMPLANT
GLOVE SRG 8 PF TXTR STRL LF DI (GLOVE) ×1 IMPLANT
GLOVE SURG UNDER POLY LF SZ8 (GLOVE) ×1
GOWN STRL REUS W/ TWL LRG LVL3 (GOWN DISPOSABLE) ×1 IMPLANT
GOWN STRL REUS W/TWL LRG LVL3 (GOWN DISPOSABLE) ×1
GOWN TOGA ZIPPER T7+ PEEL AWAY (MISCELLANEOUS) ×1 IMPLANT
HANDLE YANKAUER SUCT OPEN TIP (MISCELLANEOUS) ×1 IMPLANT
HEMOVAC 400CC 10FR (MISCELLANEOUS) ×1 IMPLANT
HOLDER FOLEY CATH W/STRAP (MISCELLANEOUS) ×1 IMPLANT
IV NS IRRIG 3000ML ARTHROMATIC (IV SOLUTION) ×1 IMPLANT
KIT TURNOVER KIT A (KITS) ×1 IMPLANT
KNIFE SCULPS 14X20 (INSTRUMENTS) ×1 IMPLANT
MANIFOLD NEPTUNE II (INSTRUMENTS) ×2 IMPLANT
NDL SPNL 20GX3.5 QUINCKE YW (NEEDLE) ×2 IMPLANT
NEEDLE SPNL 20GX3.5 QUINCKE YW (NEEDLE) ×2 IMPLANT
NS IRRIG 500ML POUR BTL (IV SOLUTION) ×1 IMPLANT
PACK TOTAL KNEE (MISCELLANEOUS) ×1 IMPLANT
PAD ABD DERMACEA PRESS 5X9 (GAUZE/BANDAGES/DRESSINGS) ×2 IMPLANT
PAD WRAPON POLAR KNEE (MISCELLANEOUS) ×1 IMPLANT
PATELLA MEDIAL ATTUN 35MM KNEE (Knees) IMPLANT
PIN DRILL FIX HALF THREAD (BIT) ×2 IMPLANT
PIN DRILL QUICK PACK ×2 IMPLANT
PIN FIXATION 1/8DIA X 3INL (PIN) ×1 IMPLANT
PULSAVAC PLUS IRRIG FAN TIP (DISPOSABLE) ×1
RETRACTOR TRAXI PANNICULUS (MISCELLANEOUS) IMPLANT
SOL PREP PVP 2OZ (MISCELLANEOUS) ×1
SOLUTION IRRIG SURGIPHOR (IV SOLUTION) ×1 IMPLANT
SOLUTION PREP PVP 2OZ (MISCELLANEOUS) ×1 IMPLANT
SPONGE DRAIN TRACH 4X4 STRL 2S (GAUZE/BANDAGES/DRESSINGS) ×1 IMPLANT
STAPLER SKIN PROX 35W (STAPLE) ×1 IMPLANT
STOCKINETTE IMPERV 14X48 (MISCELLANEOUS) ×1 IMPLANT
STRAP TIBIA SHORT (MISCELLANEOUS) ×1 IMPLANT
SUCTION FRAZIER HANDLE 10FR (MISCELLANEOUS) ×1
SUCTION TUBE FRAZIER 10FR DISP (MISCELLANEOUS) ×1 IMPLANT
SUT VIC AB 0 CT1 36 (SUTURE) ×1 IMPLANT
SUT VIC AB 1 CT1 36 (SUTURE) ×2 IMPLANT
SUT VIC AB 2-0 CT2 27 (SUTURE) ×1 IMPLANT
SYR 30ML LL (SYRINGE) ×2 IMPLANT
TIP FAN IRRIG PULSAVAC PLUS (DISPOSABLE) ×1 IMPLANT
TOWEL OR 17X26 4PK STRL BLUE (TOWEL DISPOSABLE) IMPLANT
TOWER CARTRIDGE SMART MIX (DISPOSABLE) ×1 IMPLANT
TRAP FLUID SMOKE EVACUATOR (MISCELLANEOUS) ×1 IMPLANT
TRAY FOLEY MTR SLVR 16FR STAT (SET/KITS/TRAYS/PACK) ×1 IMPLANT
WATER STERILE IRR 1000ML POUR (IV SOLUTION) ×1 IMPLANT
WRAPON POLAR PAD KNEE (MISCELLANEOUS) ×1

## 2022-05-11 NOTE — H&P (Signed)
ORTHOPAEDIC HISTORY & PHYSICAL Sherry Paul, [place] - 04/30/2022 2:15 PM EDT Formatting of this note is different from the original. Images from the original note were not included. Chief Complaint: Chief Complaint Patient presents with Right Knee - Pain Pre-op Exam  Reason for Visit: The patient is a 77 y.o. female who presents for history and physical for an upcoming right total knee arthroplasty to be done by Dr. Ernest Pine on April 10, 2022. The patient was seen on April 09, 2022 by Dr. Ernest Pine for reevaluation of her right knee. She has a long history of progressive right knee pain. She localizes most of the pain along the medial aspect of the knee. She reports some swelling, no locking, and some giving way of the knee. The pain is aggravated by any weight bearing but is present even at rest. The knee pain limits the patient's ability to ambulate long distances. The patient has not appreciated any significant improvement despite glucosamine, intraarticular corticosteroid injections, viscosupplementation, activity modification, physical therapy, and ambulatory aids. She states that she is unable to tolerate NSAIDs due to her history of a cavernous hemangioma (followed by [facility]). She is using a cane for ambulation. The patient states that the knee pain has progressed to the point that it is significantly interfering with her activities of daily living. The patient is a diabetic.  Medications: Current Outpatient Medications Medication Sig Dispense Refill alpha lipoic acid 300 mg capsule Take 300 mg by mouth 2 (two) times daily ascorbic acid, vitamin C, (VITAMIN C) 500 MG tablet Take 500 mg by mouth 2 (two) times daily ASHWAGANDHA ROOT EXTRACT ORAL Take 750 mg by mouth 2 (two) times daily calcium carbonate (CALCIUM 600 ORAL) Take 1 tablet by mouth 2 (two) times daily cholecalciferol (VITAMIN D3) 2,000 unit capsule Take 2,000 Units by mouth once daily co-enzyme Q-10, ubiquinone, 100  mg capsule Take 100 mg by mouth 4 (four) times daily GINKGO BILOBA ORAL Take 500 mg by mouth once daily GLUCOSAMINE HCL ORAL Take 680 mg by mouth 3 (three) times daily Herbal Supplement Herbal Name: Neuro-plus 100 mg three times daily Herbal Supplement Herbal Name: Tumeric 1000 mg once daily ibuprofen (MOTRIN) 200 MG tablet Take 200 mg by mouth every 6 (six) hours as needed for Pain TAKES 1 TABLET EVERY 3 WEEKS FOR HEADACHE Lactobacillus acidophilus (PROBIOTIC ORAL) Take 1 capsule by mouth every evening levothyroxine (SYNTHROID) 150 MCG tablet TAKE ONE TABLET BY MOUTH 6 DAYS PER WEEK. 86 tablet 3 lovastatin (MEVACOR) 40 MG tablet TAKE 1 TABLET BY MOUTH DAILY WITH DINNER 90 tablet 1 MULTIVITAMIN ORAL Take 1 tablet by mouth once daily nutritional tx for PKU no.68 (GLYTACTIN 15PECOMPLETE-TAURINE) 15 gram-330 kcal/81 gram Bar Take 1,000 mg by mouth at bedtime omega-3 fatty acids-fish oil 300-1,000 mg capsule Take by mouth once daily pine bark ext, pycnogenols, (PYCNOGENOL ORAL) Take 60 mg by mouth 2 (two) times daily psyllium seed, with sugar, (FIBER ORAL) Take 6 tablets by mouth every evening rutin/quercetin/bioflav/bilber (BILBERRY EXTRACT ORAL) Take 1,000 mg by mouth once daily St. John's wort 300 mg Tab Take by mouth 3 (three) times daily vit C/E/Zn/coppr/lutein/zeaxan (PRESERVISION AREDS-2 ORAL) Take by mouth 2 (two) times daily vitamin E acetate (VITAMIN E ORAL) Take 400 Units by mouth once daily VITAMIN K2 ORAL Take 100 mcg by mouth once daily Herbal Supplement Herbal Name: Ayurvedic Brain Support 1 by mouth at night (Patient not taking: Reported on 04/30/2022)  No current facility-administered medications for this visit.  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 No surgical options currently. Rt sided weakness sx. Diabetes mellitus, type 2 (CMS/HHS-HCC) 05/19/2017 Diverticulosis Dysphagia 06/07/2014 Fracture of left orbit (CMS/HHS-HCC) 2015 s/p fall. Followed by opthamology & ENT. 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  Past Surgical History: Past Surgical History: Procedure Laterality Date TONSILLECTOMY 1957 CESAREAN SECTION 1982 CESAREAN SECTION 1986 COLONOSCOPY 11/27/2010 Dr. Rosalee Kaufman @ [facility] Adenomatous Polyp in 2007 THYROIDECTOMY TOTAL Bilateral 07/27/2013 Procedure: THYROIDECTOMY TOTAL with NIMS ; Surgeon: Billy Fischer, MD; Location: [facility]; Service: General Surgery; Laterality: Bilateral; [place] 850 MONITORING CRANIAL NERVES BILATERAL Bilateral 07/27/2013 Procedure: MONITORING CRANIAL NERVES BILATERAL; Surgeon: Billy Fischer, MD; Location: [facility]; Service: General Surgery; Laterality: Bilateral; NIMS NERVE MONITOR EGD 06/25/2014 Dr. Maggie Font COLONOSCOPY 10/04/2017 [facility] Adenomatous Polyp: CBF 09/2022 CRANIECTOMY W/EXCISION POSTERIOR FOSSA MENINGIOMA Left 08/07/2019 Procedure: CRANIECTOMY W/EXCISION POSTERIOR FOSSA MENINGIOMA; Surgeon: Aletta Edouard, MD; Location: [facility]; Service: Neurosurgery; Laterality: Left; MICROSURGERY Left 08/07/2019 Procedure: MICROSURGERY; Surgeon: Aletta Edouard, MD; Location: [facility]; Service: Neurosurgery; Laterality: Left; Lap BSO Bilateral 04/12/2020  Social History: Social History  Socioeconomic History Marital status: Widowed Spouse name: Richard Number of children: 2 Occupational History Occupation: Retired Clinical research associate Tobacco Use Smoking status: Former Packs/day: 1.00 Years: 14.00 Additional pack years: 0.00 Total pack years: 14.00 Types: Cigarettes Quit date: 01/20/1979 Years since quitting: 43.3 Smokeless tobacco: Never Vaping Use Vaping Use: Never used Substance and Sexual Activity Alcohol use: Not Currently Drug use: No Sexual activity: Not Currently Partners: Male Birth control/protection: Surgical, Post-menopausal Social History Narrative She is a retired Education officer, community, and moved here from [place] with her husband to be followed by [employer]. They live in [place], [place]. They have a daughter and grandchildren who live 2 hours west of them.  Children: Rogelia Mire, Zara Chess.  She is a vegetarian.  Social Determinants of Health  Transportation Needs: No Transportation Needs (08/08/2019) PRAPARE - Contractor (Medical): No Lack of Transportation (Non-Medical): No  Family History: Family History Problem Relation Age of Onset High blood pressure (Hypertension) Mother Macular degeneration Mother Thyroid disease Mother Cancer Mother Optic nerve Hearing loss Mother Diabetes type II Mother Lymphoma Mother age 11 Hashimoto's thyroiditis Mother Kidney failure Father Abnormal EKG Father Multiple myeloma Father Myocardial Infarction (Heart attack) Father 24 Other Father cancer in toe Stroke Brother No Known Problems Daughter No Known Problems Son No Known Problems Maternal Grandmother Stroke Maternal Grandfather COD No Known Problems Paternal Grandmother Atrial fibrillation (Abnormal heart rhythm sometimes requiring treatment with blood thinners) Paternal Grandfather COD  Review of  Systems: A comprehensive 14 point ROS was performed, reviewed, and the pertinent orthopaedic findings are documented in the HPI.  Exam BP (!) 176/96  Ht 154.9 cm ( )  Wt 91.2 kg (201 lb)  BMI 37.98 kg/m  General: Well-developed, well-nourished female seen in no acute distress. Antalgic gait. Varus thrust to the right knee.  HEENT: Atraumatic, normocephalic. Pupils are equal and  reactive to light. Extraocular motion is intact. Sclera are clear. Oropharynx is clear with moist mucosa.  Neck: Supple, nontender, and with good ROM. No thyromegaly, adenopathy, JVD, or carotid bruits.  Lungs: Clear to auscultation bilaterally.  Cardiovascular: Regular rate and rhythm. Normal S1, S2. No murmur . No appreciable gallops or rubs. Peripheral pulses are palpable. No lower extremity edema. Homan`s test is negative.  Abdomen: Soft, nontender, nondistended. Bowel sounds are present.  Extremities: Good strength, stability, and range of motion of the upper extremities. Good range of motion of the hips and ankles.  Right Knee: Soft tissue swelling: mild Effusion: minimal Erythema: none Crepitance: mild Tenderness: medial Alignment: relative varus Mediolateral laxity: medial pseudolaxity Posterior sag: negative Patellar tracking: Good tracking without evidence of subluxation or tilt Atrophy: No significant atrophy. Quadriceps tone was fair to good. Range of motion: 0/3/121 degrees  Neurologic: Awake, alert, and oriented. Sensory function is intact to pinprick and light touch. Motor strength is judged to be 5/5. Motor coordination is within normal limits. No apparent clonus. No tremor.  X-rays: I ordered and interpreted standing AP, lateral, and sunrise radiographs of the right knee that were obtained in the office on April 09, 2022. There is significant narrowing of the medial cartilage space with bone-on-bone articulation and associated varus alignment. Osteophyte formation is  noted. Subchondral sclerosis is noted. No evidence of fracture or dislocation.  Impression: Degenerative arthrosis of the right knee Type 2 diabetes mellitus without complication Severe obesity  Plan: The findings were discussed in detail with the patient. The patient was given informational material on total knee replacement. Conservative treatment options were reviewed with the patient. We discussed the risks and benefits of surgical intervention. The usual perioperative course was also discussed in detail. The patient expressed understanding of the risks and benefits of surgical intervention and would like to proceed with plans for right total knee arthroplasty.  I will contact [facility] to obtain recommendations concerning anticoagulation options for postoperative DVT prophylaxis.  I spent a total of 45 minutes in both face-to-face and non-face-to-face activities, excluding procedures performed, for this visit on the date of this encounter.  MEDICAL CLEARANCE: Per anesthesiology. ACTIVITY: As tolerated. WORK STATUS: Not applicable. THERAPY: Preoperative physical therapy evaluation. MEDICATIONS: Requested Prescriptions  No prescriptions requested or ordered in this encounter  FOLLOW-UP: Return for Postoperative care.  This note will serve as the history and physical for surgery scheduled on May 11, 2022 for a right total knee arthroplasty done by Dr. Ernest Pine.  The expected discharge plan is for the patient to go home the day after surgery. She will complete physical therapy goals. She will go home with home health physical therapy.  JONATHAN T MUNDY PA-C, M.S.  This note was generated in part with voice recognition software and I apologize for any typographical errors that were not detected and corrected.  Answers submitted by the patient for this visit: Right Knee HPI Questionnaire (Submitted on 04/08/2022) Chief Complaint: HPI - Right Knee How did your symptoms  begin?: gradually without injury Please select all the associated symptoms of your problem: right hip pain How often does the pain occur?: constant Since the onset of your problem, has the pain improved, worsened, or stayed the same?: not changing Does your knee pain prevent you from performing your hobbies?: Yes Are you able to perform sports at the same level as before the injury?: No Does your knee feel grossly unstable (can't put weight on it)?: Yes Do your symptoms wake you from  sleep?: Yes Rate your current activity of daily living as % of normal: 40% Have you had prior medical provider visits for this problem? (choose all that apply): physical therapy Have you had prior medical treatments for this problem? (choose all that apply): other Other prior medical treatment - please explain: shot in my knee Please choose all the items that improve your symptoms: glucosamine, injections, physical therapy, rest Please choose all the items that worsen your symptoms: activity, bending, carrying heavy objects, exercise, normal daily activities, playing sports, rest, rising from a chair, sitting, sleeping, standing, stooping, using stairs, walking, weight bearing Please select any aids that you use to ambulate (choose all that apply): uses a cane or walker What is your current ambulatory (walking) status?: can ambulate indoors only What is your current activity level?: no regular exercise Please indicate all the studies you have had for this problem (choose all that apply): x-rays  Electronically signed by Sherry Bo, PA at 04/30/2022 1:57 PM EDT

## 2022-05-11 NOTE — Interval H&P Note (Signed)
History and Physical Interval Note:  05/11/2022 11:14 AM  Sherry Paul  has presented today for surgery, with the diagnosis of PRIMARY OSTEOARTHRITIS OF RIGHT KNEE..  The various methods of treatment have been discussed with the patient and family. After consideration of risks, benefits and other options for treatment, the patient has consented to  Procedure(s): COMPUTER ASSISTED TOTAL KNEE ARTHROPLASTY (Right) as a surgical intervention.  The patient's history has been reviewed, patient examined, no change in status, stable for surgery.  I have reviewed the patient's chart and labs.  Questions were answered to the patient's satisfaction.     Yoshino Broccoli P Mathieu Schloemer

## 2022-05-11 NOTE — Anesthesia Preprocedure Evaluation (Signed)
Anesthesia Evaluation  Patient identified by MRN, date of birth, ID band Patient awake    Reviewed: Allergy & Precautions, NPO status , Patient's Chart, lab work & pertinent test results  History of Anesthesia Complications Negative for: history of anesthetic complications  Airway Mallampati: III  TM Distance: <3 FB Neck ROM: full    Dental  (+) Chipped   Pulmonary asthma , sleep apnea , former smoker   Pulmonary exam normal        Cardiovascular negative cardio ROS Normal cardiovascular exam     Neuro/Psych negative neurological ROS  negative psych ROS   GI/Hepatic negative GI ROS, Neg liver ROS,,,  Endo/Other  diabetes, Type 2Hypothyroidism    Renal/GU      Musculoskeletal   Abdominal   Peds  Hematology negative hematology ROS (+)   Anesthesia Other Findings Past Medical History: No date: Arthritis No date: Asthma No date: Cancer     Comment:  thyroid ca No date: Cavernous hemangioma of brain No date: Diabetes mellitus without complication No date: Hypothyroidism No date: Onychomycosis No date: Osteoporosis  Past Surgical History: No date: CESAREAN SECTION     Comment:  x 2 10/04/2017: COLONOSCOPY WITH PROPOFOL; N/A     Comment:  Procedure: COLONOSCOPY WITH PROPOFOL;  Surgeon: Scot Jun, MD;  Location[facility];  Service:               Endoscopy;  Laterality: N/A; 06/25/2014: ESOPHAGOGASTRODUODENOSCOPY; N/A     Comment:  Procedure: ESOPHAGOGASTRODUODENOSCOPY (EGD);  Surgeon:               Scot Jun, MD;  Location: [facility] ENDOSCOPY;                Service: Endoscopy;  Laterality: N/A; 04/12/2020: LAPAROSCOPIC BILATERAL SALPINGO OOPHERECTOMY; Bilateral     Comment:  Procedure: LAPAROSCOPIC BILATERAL SALPINGO OOPHORECTOMY;              Surgeon: Schermerhorn, Ihor Austin, MD;  Location: [facility] ORS;              Service: Gynecology;  Laterality: Bilateral; No date: monitoring  cranial nerves bilateral No date: OOPHORECTOMY; Bilateral 06/25/2014: SAVORY DILATION; N/A     Comment:  Procedure: SAVORY DILATION;  Surgeon: Scot Jun,               MD;  Location: [facility] ENDOSCOPY;  Service: Endoscopy;                Laterality: N/A; No date: TONSILLECTOMY No date: TOTAL THYROIDECTOMY  BMI    Body Mass Index: 38.03 kg/m      Reproductive/Obstetrics negative OB ROS                             Anesthesia Physical Anesthesia Plan  ASA: 3  Anesthesia Plan: General ETT   Post-op Pain Management:    Induction: Intravenous  PONV Risk Score and Plan: Ondansetron, Dexamethasone, Midazolam and Treatment may vary due to age or medical condition  Airway Management Planned: Oral ETT  Additional Equipment:   Intra-op Plan:   Post-operative Plan: Extubation in OR  Informed Consent: I have reviewed the patients History and Physical, chart, labs and discussed the procedure including the risks, benefits and alternatives for the proposed anesthesia with the patient or authorized representative who has indicated his/her understanding and acceptance.  Dental Advisory Given  Plan Discussed with: Anesthesiologist, CRNA and Surgeon  Anesthesia Plan Comments: (Spoke with the patient at length about doing a spinal in the setting of her brain mass and problems that [place] accessing her intrathecal space could cause for an ICP standpoint.  We decided to proceed with an general anesthetic.  Patient consented for risks of anesthesia including but not limited to:  - adverse reactions to medications - damage to eyes, teeth, lips or other oral mucosa - nerve damage due to positioning  - sore throat or hoarseness - Damage to heart, brain, nerves, lungs, other parts of body or loss of life  Patient voiced understanding.)       Anesthesia Quick Evaluation

## 2022-05-11 NOTE — Transfer of Care (Signed)
Immediate Anesthesia Transfer of Care Note  Patient: Sherry Paul  Procedure(s) Performed: COMPUTER ASSISTED TOTAL KNEE ARTHROPLASTY (Right: Knee)  Patient Location: PACU  Anesthesia Type:General  Level of Consciousness: drowsy  Airway & Oxygen Therapy: Patient Spontanous Breathing and Patient connected to face mask oxygen  Post-op Assessment: Report given to RN and Post -op Vital signs reviewed and stable  Post vital signs: Reviewed and stable  Last Vitals:  Vitals Value Taken Time  BP 119/62 05/11/22 1536  Temp 36.4 C 05/11/22 1536  Pulse 77 05/11/22 1538  Resp 12 05/11/22 1538  SpO2 95 % 05/11/22 1538  Vitals shown include unvalidated device data.  Last Pain:  Vitals:   05/11/22 1536  TempSrc:   PainSc: Asleep      Patients Stated Pain Goal: 0 (05/11/22 0936)  Complications: No notable events documented.

## 2022-05-11 NOTE — Anesthesia Procedure Notes (Signed)
Procedure Name: Intubation Date/Time: 05/11/2022 11:58 AM  Performed by: Berniece Pap, CRNAPre-anesthesia Checklist: Patient identified, Emergency Drugs available, Suction available and Patient being monitored Patient Re-evaluated:Patient Re-evaluated prior to induction Oxygen Delivery Method: Circle system utilized Preoxygenation: Pre-oxygenation with 100% oxygen Induction Type: IV induction Ventilation: Mask ventilation without difficulty Laryngoscope Size: McGraph and 3 Grade View: Grade I Tube type: Oral Tube size: 7.0 mm Number of attempts: 1 Airway Equipment and Method: Stylet and Oral airway Placement Confirmation: ETT inserted through vocal cords under direct vision, positive ETCO2 and breath sounds checked- equal and bilateral Secured at: 21 cm Tube secured with: Tape Dental Injury: Teeth and Oropharynx as per pre-operative assessment

## 2022-05-11 NOTE — Op Note (Signed)
OPERATIVE NOTE  DATE OF SURGERY:  05/11/2022  PATIENT NAME:  Sherry Paul   DOB: 10-18-1945  MRN: [id number]  PRE-OPERATIVE DIAGNOSIS: Degenerative arthrosis of the right knee, primary  POST-OPERATIVE DIAGNOSIS:  Same  PROCEDURE:  Right total knee arthroplasty using computer-assisted navigation  SURGEON:  Jena Gauss. M.D.  ANESTHESIA: general  ESTIMATED BLOOD LOSS: 50 mL  FLUIDS REPLACED: 1200 mL of crystalloid  TOURNIQUET TIME: 81 minutes  DRAINS: 2 medium Hemovac drains  SOFT TISSUE RELEASES: Anterior cruciate ligament, posterior cruciate ligament, deep medial collateral ligament, patellofemoral ligament  IMPLANTS UTILIZED: DePuy Attune size 5N posterior stabilized femoral component (cemented), size 4 rotating platform tibial component (cemented), 35 mm medialized dome patella (cemented), and a 5 mm stabilized rotating platform polyethylene insert.  INDICATIONS FOR SURGERY: Sherry Paul is a 77 y.o. year old female with a long history of progressive knee pain. X-rays demonstrated severe degenerative changes in tricompartmental fashion. The patient had not seen any significant improvement despite conservative nonsurgical intervention. After discussion of the risks and benefits of surgical intervention, the patient expressed understanding of the risks benefits and agree with plans for total knee arthroplasty.   The risks, benefits, and alternatives were discussed at length including but not limited to the risks of infection, bleeding, nerve injury, stiffness, blood clots, the need for revision surgery, cardiopulmonary complications, among others, and they were willing to proceed.  PROCEDURE IN DETAIL: The patient was brought into the operating room and, after adequate general anesthesia was achieved, a tourniquet was placed on the patient's upper thigh. The patient's knee and leg were cleaned and prepped with alcohol and DuraPrep and draped in the usual  sterile fashion. A "timeout" was performed as per usual protocol. The lower extremity was exsanguinated using an Esmarch, and the tourniquet was inflated to 300 mmHg. An anterior longitudinal incision was made followed by a standard mid vastus approach. The deep fibers of the medial collateral ligament were elevated in a subperiosteal fashion off of the medial flare of the tibia so as to maintain a continuous soft tissue sleeve. The patella was subluxed laterally and the patellofemoral ligament was incised. Inspection of the knee demonstrated severe degenerative changes with full-thickness loss of articular cartilage. Osteophytes were debrided using a rongeur. Anterior and posterior cruciate ligaments were excised. Two 4.0 mm Schanz pins were inserted in the femur and into the tibia for attachment of the array of trackers used for computer-assisted navigation. Hip center was identified using a circumduction technique. Distal landmarks were mapped using the computer. The distal femur and proximal tibia were mapped using the computer. The distal femoral cutting guide was positioned using computer-assisted navigation so as to achieve a 5 distal valgus cut. The femur was sized and it was felt that a size 5N femoral component was appropriate. A size 5 femoral cutting guide was positioned and the anterior cut was performed and verified using the computer. This was followed by completion of the posterior and chamfer cuts. Femoral cutting guide for the central box was then positioned in the center box cut was performed.  Attention was then directed to the proximal tibia. Medial and lateral menisci were excised. The extramedullary tibial cutting guide was positioned using computer-assisted navigation so as to achieve a 0 varus-valgus alignment and 3 posterior slope. The cut was performed and verified using the computer. The proximal tibia was sized and it was felt that a size 4 tibial tray was appropriate. Tibial and  femoral trials were inserted followed  by insertion of a 5 mm polyethylene insert. This allowed for excellent mediolateral soft tissue balancing both in flexion and in full extension. Finally, the patella was cut and prepared so as to accommodate a 35 mm medialized dome patella. A patella trial was placed and the knee was placed through a range of motion with excellent patellar tracking appreciated. The femoral trial was removed after debridement of posterior osteophytes. The central post-hole for the tibial component was reamed followed by insertion of a keel punch. Tibial trials were then removed. Cut surfaces of bone were irrigated with copious amounts of normal saline using pulsatile lavage and then suctioned dry. Polymethylmethacrylate cement with gentamicin was prepared in the usual fashion using a vacuum mixer. Cement was applied to the cut surface of the proximal tibia as well as along the undersurface of a size 4 rotating platform tibial component. Tibial component was positioned and impacted into place. Excess cement was removed using Personal assistant. Cement was then applied to the cut surfaces of the femur as well as along the posterior flanges of the size 5N femoral component. The femoral component was positioned and impacted into place. Excess cement was removed using Personal assistant. A 5 mm polyethylene trial was inserted and the knee was brought into full extension with steady axial compression applied. Finally, cement was applied to the backside of a 35 mm medialized dome patella and the patellar component was positioned and patellar clamp applied. Excess cement was removed using Personal assistant. After adequate curing of the cement, the tourniquet was deflated after a total tourniquet time of 81 minutes. Hemostasis was achieved using electrocautery. The knee was irrigated with copious amounts of normal saline using pulsatile lavage followed by 450 ml of Surgiphor and then suctioned dry. 20 mL of 1.3%  Exparel and 60 mL of 0.25% Marcaine in 40 mL of normal saline was injected along the posterior capsule, medial and lateral gutters, and along the arthrotomy site. A 35 mm stabilized rotating platform polyethylene insert was inserted and the knee was placed through a range of motion with excellent mediolateral soft tissue balancing appreciated and excellent patellar tracking noted. 2 medium drains were placed in the wound bed and brought out through separate stab incisions. The medial parapatellar portion of the incision was reapproximated using interrupted sutures of #1 Vicryl. Subcutaneous tissue was approximated in layers using first #0 Vicryl followed #2-0 Vicryl. The skin was approximated with skin staples. A sterile dressing was applied.  The patient tolerated the procedure well and was transported to the recovery room in stable condition.    Baker Moronta P. Angie Fava., M.D.

## 2022-05-11 NOTE — Anesthesia Postprocedure Evaluation (Signed)
Anesthesia Post Note  Patient: Dyann Goodspeed  Procedure(s) Performed: COMPUTER ASSISTED TOTAL KNEE ARTHROPLASTY (Right: Knee)  Patient location during evaluation: PACU Anesthesia Type: General Level of consciousness: awake and alert Pain management: pain level controlled Vital Signs Assessment: post-procedure vital signs reviewed and stable Respiratory status: spontaneous breathing, nonlabored ventilation, respiratory function stable and patient connected to nasal cannula oxygen Cardiovascular status: blood pressure returned to baseline and stable Postop Assessment: no apparent nausea or vomiting Anesthetic complications: no   No notable events documented.   Last Vitals:  Vitals:   05/11/22 1700 05/11/22 1721  BP: 122/66 119/70  Pulse: 60 78  Resp: 12 14  Temp: (!) 36.2 C 36.4 C  SpO2: 98% 100%    Last Pain:  Vitals:   05/11/22 1721  TempSrc: Temporal  PainSc:                  Corinda Gubler

## 2022-05-11 NOTE — Progress Notes (Signed)
Patient is not able to walk the distance required to go the bathroom, or he/she is unable to safely negotiate stairs required to access the bathroom.  A 3in1 BSC will alleviate this problem   Hendrix Console P. Demitra Danley, Jr. M.D.  

## 2022-05-12 ENCOUNTER — Encounter: Payer: Self-pay | Admitting: Orthopedic Surgery

## 2022-05-12 DIAGNOSIS — M1711 Unilateral primary osteoarthritis, right knee: Secondary | ICD-10-CM | POA: Diagnosis not present

## 2022-05-12 MED ORDER — OXYCODONE HCL 5 MG PO TABS
ORAL_TABLET | ORAL | Status: AC
  Filled 2022-05-12: qty 1

## 2022-05-12 MED ORDER — TRAMADOL HCL 50 MG PO TABS: 50.0000 mg | ORAL_TABLET | ORAL | 0 refills | Status: AC | PRN

## 2022-05-12 MED ORDER — SENNOSIDES-DOCUSATE SODIUM 8.6-50 MG PO TABS
ORAL_TABLET | ORAL | Status: AC
  Filled 2022-05-12: qty 1

## 2022-05-12 MED ORDER — MAGNESIUM HYDROXIDE 400 MG/5ML PO SUSP
ORAL | Status: AC
  Filled 2022-05-12: qty 30

## 2022-05-12 MED ORDER — ASPIRIN 81 MG PO CHEW: 81.0000 mg | CHEWABLE_TABLET | Freq: Two times a day (BID) | ORAL | 1 refills | Status: AC

## 2022-05-12 MED ORDER — ASPIRIN 81 MG PO CHEW
CHEWABLE_TABLET | ORAL | Status: AC
  Filled 2022-05-12: qty 1

## 2022-05-12 MED ORDER — OXYCODONE HCL 5 MG PO TABS: 5.0000 mg | ORAL_TABLET | ORAL | 0 refills | Status: AC | PRN

## 2022-05-12 MED ORDER — PANTOPRAZOLE SODIUM 40 MG PO TBEC
DELAYED_RELEASE_TABLET | ORAL | Status: AC
  Filled 2022-05-12: qty 1

## 2022-05-12 MED ORDER — METOCLOPRAMIDE HCL 10 MG PO TABS
ORAL_TABLET | ORAL | Status: AC
  Filled 2022-05-12: qty 1

## 2022-05-12 NOTE — Discharge Summary (Signed)
Physician Discharge Summary  Subjective: 1 Day Post-Op Procedure(s) (LRB): COMPUTER ASSISTED TOTAL KNEE ARTHROPLASTY (Right) Patient reports pain as mild.   Patient seen in rounds with Dr. Ernest Pine. Patient is well, and has had no acute complaints or problems Patient is ready to go home with home health physical therapy  Physician Discharge Summary  Patient ID: Sherry Paul MRN: [id number] DOB/AGE: Mar 07, 1945 77 y.o.  Admit date: 05/11/2022 Discharge date: 05/12/2022  Admission Diagnoses:  Discharge Diagnoses:  Principal Problem:   Total knee replacement status   Discharged Condition: fair  Hospital Course: The patient is postop day 1 from a right total knee arthroplasty.  She is doing well since surgery.  Her vitals have remained stable.  Her pain is manageable.  The patient had her drain removed this morning.  She will do physical therapy this morning before going home with home health physical therapy  Treatments: surgery:  Right total knee arthroplasty using computer-assisted navigation   SURGEON:  Jena Gauss. M.D.   ANESTHESIA: general   ESTIMATED BLOOD LOSS: 50 mL   FLUIDS REPLACED: 1200 mL of crystalloid   TOURNIQUET TIME: 81 minutes   DRAINS: 2 medium Hemovac drains   SOFT TISSUE RELEASES: Anterior cruciate ligament, posterior cruciate ligament, deep medial collateral ligament, patellofemoral ligament   IMPLANTS UTILIZED: DePuy Attune size 5N posterior stabilized femoral component (cemented), size 4 rotating platform tibial component (cemented), 35 mm medialized dome patella (cemented), and a 5 mm stabilized rotating platform polyethylene insert.  Discharge Exam: Blood pressure 104/60, pulse 86, temperature 97.8 F (36.6 C), temperature source Tympanic, resp. rate 17, height  (1.549 m), weight 91.3 kg, SpO2 96 %.   Disposition: Discharge disposition: 01-Home or Self Care        Allergies as of 05/12/2022       Reactions   Codeine  Other (See Comments)   Bizarre idealization     Huperzine A Other (See Comments)   Confusion   Omeprazole Other (See Comments)   Confusion        Medication List     STOP taking these medications    ibuprofen 200 MG tablet Commonly known as: ADVIL       TAKE these medications    ALPHA LIPOIC ACID PO Take 300 mg by mouth in the morning and at bedtime.   ascorbic acid 500 MG tablet Commonly known as: VITAMIN C Take 500 mg by mouth 2 (two) times daily.   ASHWAGANDHA PO Take 750 mg by mouth in the morning and at bedtime.   aspirin 81 MG chewable tablet Chew 1 tablet (81 mg total) by mouth 2 (two) times daily.   CALCIUM 600 + D PO Take 1 tablet by mouth 2 (two) times daily.   Coenzyme Q10 100 MG capsule Take 100 mg by mouth in the morning, at noon, in the evening, and at bedtime.   Fiber 500 MG Caps Take 6 tablets by mouth daily.   Fish Oil 1000 MG Caps Take 1,000 mg by mouth daily.   GINKOBA PO Take 60 mg by mouth in the morning and at bedtime.   Glucosamine Sulfate 500 MG Caps Take 680 mg by mouth in the morning, at noon, and at bedtime.   ibandronate 150 MG tablet Commonly known as: BONIVA Take 150 mg by mouth every 30 (thirty) days. Take in the morning with a full glass of water, on an empty stomach, and do not take anything else by mouth or lie down for  the next 30 min.   LEVOCARNITINE-B5-TAURINE PO Take 1,000 mg by mouth daily.   levothyroxine 150 MCG tablet Commonly known as: SYNTHROID Take 75-150 mcg by mouth daily. 1 tab 6 days a week, 75 mcg on Saturday   loratadine 10 MG tablet Commonly known as: CLARITIN Take 10 mg by mouth daily as needed for allergies.   lovastatin 40 MG tablet Commonly known as: MEVACOR Take 40 mg by mouth at bedtime.   multivitamin with minerals Tabs tablet Take 1 tablet by mouth daily.   OVER THE COUNTER MEDICATION Take 500 mg by mouth in the morning and at bedtime. Bacopa   OVER THE COUNTER MEDICATION Take  1,000 mg by mouth daily. [place]   OVER THE COUNTER MEDICATION Take 100 mg by mouth in the morning and at bedtime. Neuro PS   OVER THE COUNTER MEDICATION Take 60 mg by mouth in the morning and at bedtime. Pycnogenol   oxyCODONE 5 MG immediate release tablet Commonly known as: Oxy IR/ROXICODONE Take 1 tablet (5 mg total) by mouth every 4 (four) hours as needed for moderate pain (pain score 4-6).   PRESERVISION AREDS 2 PO Take 1 tablet by mouth in the morning and at bedtime.   PROBIOTIC DAILY PO Take 1 capsule by mouth in the morning and at bedtime.   psyllium 0.52 g capsule Commonly known as: REGULOID Take 6 capsules by mouth daily.   QC TUMERIC COMPLEX PO Take 800 mg by mouth in the morning and at bedtime.   St Johns Wort 300 MG Caps Take 300 mg by mouth 2 (two) times daily.   traMADol 50 MG tablet Commonly known as: ULTRAM Take 1-2 tablets (50-100 mg total) by mouth every 4 (four) hours as needed for moderate pain.   Vitamin D 50 MCG (2000 UT) tablet Take 2,000 Units by mouth daily.   vitamin E 180 MG (400 UNITS) capsule Take 400 Units by mouth daily.   Vitamin K2 100 MCG Caps Take 600 mcg by mouth daily.               Durable Medical Equipment  (From admission, onward)           Start     Ordered   05/11/22 1635  DME Walker rolling  Once       Question:  Patient needs a walker to treat with the following condition  Answer:  Total knee replacement status   05/11/22 1634   05/11/22 1635  DME Bedside commode  Once       Comments: Patient is not able to walk the distance required to go the bathroom, or he/she is unable to safely negotiate stairs required to access the bathroom.  A 3in1 BSC will alleviate this problem  Question:  Patient needs a bedside commode to treat with the following condition  Answer:  Total knee replacement status   05/11/22 1634            Follow-up Information     Dedra Skeens, PA-C Follow up on 05/25/2022.   Specialty:  Orthopedic Surgery Why: at 10:45am Contact information: [address] [place] [address] [phone]         Donato Heinz, MD Follow up on 06/23/2022.   Specialty: Orthopedic Surgery Why: at 2:30pm Contact information: [address] [facility] [address] [facility] [place] [address] [phone]                 Signed: Dedra Skeens 05/12/2022,  7:14 AM   Objective: Vital signs in last 24 hours: Temp:  [97.1 F (36.2 C)-98.2 F (36.8 C)] 97.8 F (36.6 C) (04/23 0500) Pulse Rate:  [60-94] 86 (04/23 0500) Resp:  [12-18] 17 (04/23 0500) BP: (104-129)/(60-77) 104/60 (04/23 0500) SpO2:  [92 %-100 %] 96 % (04/23 0500) Weight:  [91.3 kg] 91.3 kg (04/22 0936)  Intake/Output from previous day:  Intake/Output Summary (Last 24 hours) at 05/12/2022 0714 Last data filed at 05/12/2022 0606 Gross per 24 hour  Intake 3056.21 ml  Output 670 ml  Net 2386.21 ml    Intake/Output this shift: No intake/output data recorded.  Labs: No results for input(s): "HGB" in the last 72 hours. No results for input(s): "WBC", "RBC", "HCT", "PLT" in the last 72 hours. No results for input(s): "NA", "K", "CL", "CO2", "BUN", "CREATININE", "GLUCOSE", "CALCIUM" in the last 72 hours. No results for input(s): "LABPT", "INR" in the last 72 hours.  EXAM: General - Patient is Alert and Oriented Extremity - Neurovascular intact Sensation intact distally Dorsiflexion/Plantar flexion intact Compartment soft Incision - clean, dry, with the Hemovac drain removed with no complication.  The Hemovac tubing was intact on removal. Motor Function -plantarflexion and dorsiflexion are intact.  Able to straight leg raise independently  Assessment/Plan: 1 Day Post-Op Procedure(s) (LRB): COMPUTER ASSISTED TOTAL KNEE ARTHROPLASTY (Right) Procedure(s) (LRB): COMPUTER ASSISTED TOTAL KNEE ARTHROPLASTY (Right) Past Medical History:  Diagnosis Date   Arthritis    Asthma     Cancer    thyroid ca   Cavernous hemangioma of brain    Diabetes mellitus without complication    Hypothyroidism    Onychomycosis    Osteoporosis    Principal Problem:   Total knee replacement status  Estimated body mass index is 38.03 kg/m as calculated from the following:   Height as of this encounter: 5\' 1"  (1.549 m).   Weight as of this encounter: 91.3 kg. Advance diet Up with therapy D/C IV fluids Discharge home with home health Diet - Regular diet Follow up - in 2 weeks Activity - WBAT Disposition - Home Condition Upon Discharge - Stable DVT Prophylaxis - Aspirin  Dedra Skeens, PA-C Orthopaedic Surgery 05/12/2022, 7:14 AM

## 2022-05-12 NOTE — Evaluation (Signed)
Physical Therapy Evaluation Patient Details Name: Sherry Paul MRN: [id number] DOB: 01-May-1945 Today's Date: 05/12/2022  History of Present Illness  PT is 77 y/o female s/p R total knee arthroplasty.  Clinical Impression  Pt admitted with above diagnosis.  Pt currently with functional limitations due to the deficits listed below (see PT Problem List). Pt received upright in bed agreeable to PT services. Reports at baseline she is indep with household mobility and mod-I using SPC in community and independently completing ADL's/IADL's living alone.  To date, PT reviewing HEP (reps/sets/frequency), R TKA precautions and WB status with pt verbalizing understanding. Pt with good understanding of HEP prior to OOB mobility exiting R bed at supervision and standing at minguard. Pt ambulating with R sided antalgic gait at supervision level with difficulty completing heel strike at initial contact due to AROM deficits but good stability in stance phase on RLE. Pt completes seated toileting and pericare independently standing from elevated potty seat at supervision completing overall 200' of gait and safely demonstrating ability to asc/desc stairs with correct LE sequencing. Pt in recliner with RLE in extension at knee reviewing care transfer with all needs in reach. AROM currently 4-62 degrees. Pt will benefit from f/u PT resources to progress acute deficits from R TKA. Pt safe to d/c home with needed DME and family support from acute setting.     Recommendations for follow up therapy are one component of a multi-disciplinary discharge planning process, led by the attending physician.  Recommendations may be updated based on patient status, additional functional criteria and insurance authorization.     Assistance Recommended at Discharge Intermittent Supervision/Assistance  Patient can return home with the following  A little help with walking and/or transfers;Assistance with  cooking/housework;Assist for transportation;Help with stairs or ramp for entrance    Equipment Recommendations Rolling walker (2 wheels);BSC/3in1  Recommendations for Other Services       Functional Status Assessment Patient has had a recent decline in their functional status and demonstrates the ability to make significant improvements in function in a reasonable and predictable amount of time.     Precautions / Restrictions Precautions Precautions: Fall;Knee Precaution Booklet Issued: Yes (comment) Restrictions Weight Bearing Restrictions: Yes RLE Weight Bearing: Weight bearing as tolerated      Mobility  Bed Mobility Overal bed mobility: Needs Assistance Bed Mobility: Supine to Sit     Supine to sit: Supervision, HOB elevated       Patient Response: Cooperative  Transfers Overall transfer level: Needs assistance Equipment used: Rolling walker (2 wheels) Transfers: Sit to/from Stand Sit to Stand: Supervision           General transfer comment: VC's for hand placement    Ambulation/Gait Ambulation/Gait assistance: Supervision Gait Distance (Feet): 200 Feet Assistive device: Rolling walker (2 wheels) Gait Pattern/deviations: Step-to pattern, Decreased step length - left, Decreased stance time - right, Antalgic       General Gait Details: Generally antalgic gait with limited ability to complete consistent heel strike in initial contact phase of gait due to limited R TKE  Stairs Stairs: Yes Stairs assistance: Supervision Stair Management: One rail Right, One rail Left, Step to pattern, Forwards Number of Stairs: 4 General stair comments: asc/desc safely with correct LE sequencing after education.  Wheelchair Mobility    Modified Rankin (Stroke Patients Only)       Balance Overall balance assessment: Needs assistance Sitting-balance support: Bilateral upper extremity supported, Feet supported Sitting balance-Leahy Scale: Normal     Standing  balance support: No upper extremity supported Standing balance-Leahy Scale: Fair Standing balance comment: maintain standing without UE support                             Pertinent Vitals/Pain Pain Assessment Pain Assessment: 0-10 Pain Score: 3  Pain Location: R knee Pain Descriptors / Indicators: Discomfort Pain Intervention(s): Limited activity within patient's tolerance, Monitored during session, Repositioned    Home Living Family/patient expects to be discharged to:: Private residence Living Arrangements: Alone Available Help at Discharge: Family;Available 24 hours/day Type of Home: Apartment Home Access: Level entry (small threshold)     Alternate Level Stairs-Number of Steps: flight Home Layout: Two level;Able to live on main level with bedroom/bathroom Home Equipment: Educational psychologist (4 wheels);Cane - single point      Prior Function Prior Level of Function : Independent/Modified Independent                     Hand Dominance   Dominant Hand: Right    Extremity/Trunk Assessment   Upper Extremity Assessment Upper Extremity Assessment: Overall WFL for tasks assessed    Lower Extremity Assessment Lower Extremity Assessment: Defer to PT evaluation RLE Deficits / Details: AROM deficits as expected       Communication   Communication: No difficulties  Cognition Arousal/Alertness: Awake/alert Behavior During Therapy: WFL for tasks assessed/performed Overall Cognitive Status: Within Functional Limits for tasks assessed                                          General Comments      Exercises Total Joint Exercises Ankle Circles/Pumps: AROM, Strengthening, 10 reps, Both, Supine Quad Sets: AROM, Strengthening, Right, 10 reps, Supine Short Arc Quad: AROM, Strengthening, Right, 10 reps, Supine Heel Slides: AROM, Strengthening, Right, 10 reps, Supine Hip ABduction/ADduction: AROM, Strengthening, Right, 10 reps,  Supine Straight Leg Raises: AAROM, Strengthening, Right, 10 reps, Supine Long Arc Quad: AROM, Strengthening, Right, 10 reps, Seated Goniometric ROM: 4-62 degrees Marching in Standing: AROM, Strengthening, Both, 5 reps, Standing Other Exercises Other Exercises: Role of PT in acute setting, d/c recs, DME needs, safe use of RW, car transfer, HEP packet with review on reps/sets/frequency   Assessment/Plan    PT Assessment Patient needs continued PT services  PT Problem List Decreased strength;Decreased range of motion;Decreased knowledge of use of DME;Decreased mobility       PT Treatment Interventions DME instruction;Balance training;Gait training;Neuromuscular re-education;Stair training;Functional mobility training;Patient/family education;Therapeutic activities;Therapeutic exercise    PT Goals (Current goals can be found in the Care Plan section)  Acute Rehab PT Goals Patient Stated Goal: To go home PT Goal Formulation: With patient Time For Goal Achievement: 05/26/22 Potential to Achieve Goals: Good    Frequency BID     Co-evaluation               AM-PAC PT "6 Clicks" Mobility  Outcome Measure Help needed turning from your back to your side while in a flat bed without using bedrails?: A Little Help needed moving from lying on your back to sitting on the side of a flat bed without using bedrails?: A Little Help needed moving to and from a bed to a chair (including a wheelchair)?: A Little Help needed standing up from a chair using your arms (e.g., wheelchair or bedside chair)?: A Little Help needed  to walk in hospital room?: A Little Help needed climbing 3-5 steps with a railing? : A Little 6 Click Score: 18    End of Session Equipment Utilized During Treatment: Gait belt Activity Tolerance: Patient tolerated treatment well Patient left: in chair;with call bell/phone within reach Nurse Communication: Mobility status PT Visit Diagnosis: Other abnormalities of gait  and mobility (R26.89);Difficulty in walking, not elsewhere classified (R26.2);Muscle weakness (generalized) (M62.81)    Time: 1610-9604 PT Time Calculation (min) (ACUTE ONLY): 34 min   Charges:   PT Evaluation $PT Eval Low Complexity: 1 Low PT Treatments $Gait Training: 8-22 mins       Delphia Grates. Fairly IV, PT, DPT Physical Therapist- [place]  [facility]  05/12/2022, 10:41 AM

## 2022-05-12 NOTE — Progress Notes (Signed)
  Subjective: 1 Day Post-Op Procedure(s) (LRB): COMPUTER ASSISTED TOTAL KNEE ARTHROPLASTY (Right) Patient reports pain as mild.   Patient is well, and has had no acute complaints or problems Plan is to go Home after hospital stay. Negative for chest pain and shortness of breath Fever: no Gastrointestinal: Negative for nausea and vomiting  Objective: Vital signs in last 24 hours: Temp:  [97.1 F (36.2 C)-98.2 F (36.8 C)] 97.8 F (36.6 C) (04/23 0500) Pulse Rate:  [60-94] 86 (04/23 0500) Resp:  [12-18] 17 (04/23 0500) BP: (104-129)/(60-77) 104/60 (04/23 0500) SpO2:  [92 %-100 %] 96 % (04/23 0500) Weight:  [91.3 kg] 91.3 kg (04/22 0936)  Intake/Output from previous day:  Intake/Output Summary (Last 24 hours) at 05/12/2022 0709 Last data filed at 05/12/2022 0606 Gross per 24 hour  Intake 3056.21 ml  Output 670 ml  Net 2386.21 ml    Intake/Output this shift: No intake/output data recorded.  Labs: No results for input(s): "HGB" in the last 72 hours. No results for input(s): "WBC", "RBC", "HCT", "PLT" in the last 72 hours. No results for input(s): "NA", "K", "CL", "CO2", "BUN", "CREATININE", "GLUCOSE", "CALCIUM" in the last 72 hours. No results for input(s): "LABPT", "INR" in the last 72 hours.   EXAM General - Patient is Alert and Oriented Extremity - Neurovascular intact Sensation intact distally Dorsiflexion/Plantar flexion intact Compartment soft Dressing/Incision - clean, dry, with the Hemovac removed with no complication.  The Aquacel bandage was left in place. Motor Function - intact, moving foot and toes well on exam.  Able to straight leg raise independently  Past Medical History:  Diagnosis Date   Arthritis    Asthma    Cancer    thyroid ca   Cavernous hemangioma of brain    Diabetes mellitus without complication    Hypothyroidism    Onychomycosis    Osteoporosis     Assessment/Plan: 1 Day Post-Op Procedure(s) (LRB): COMPUTER ASSISTED TOTAL KNEE  ARTHROPLASTY (Right) Principal Problem:   Total knee replacement status  Estimated body mass index is 38.03 kg/m as calculated from the following:   Height as of this encounter:  (1.549 m).   Weight as of this encounter: 91.3 kg. Advance diet Up with therapy D/C IV fluids Discharge home with home health  DVT Prophylaxis - Aspirin, Foot Pumps, and TED hose Weight-Bearing as tolerated to right leg  Dedra Skeens, PA-C Orthopaedic Surgery 05/12/2022, 7:09 AM

## 2022-05-12 NOTE — Progress Notes (Signed)
DISCHARGE NOTE:  Pt given discharge instructions and verbalized understanding. TED hose on both legs, hearing aids on, BSC, walker, and extra honeycomb dressing sent with pt. Pt wheeled to car by staff. Family providing transportation.

## 2022-05-12 NOTE — Evaluation (Signed)
Occupational Therapy Evaluation Patient Details Name: Sherry Paul MRN: [id number] DOB: 1945/11/17 Today's Date: 05/12/2022   History of Present Illness PT is 77 y/o female s/p R total knee arthroplasty.   Clinical Impression   Upon entering the room, pt supine in bed and agreeable to OT intervention. Pt is pleasant and cooperative throughout and is motivated to return home with family assistance as needed. OT educated pt on use of polar care and discussed strategies to increased Ind with LB self care and use of AE as needed. Pt donning LB clothing with min A to thread onto B LEs with education to utilize [facility] reacher at home. Pt stands with supervision to pull over B hips. Pt dons UB clothing items with set up A. She ambulated 77' to bathroom with RW and supervision to stand at sink for grooming tasks before returning back to recliner chair. Pt feels confident about discharge home and all education is completed. OT to complete orders at this time.      Recommendations for follow up therapy are one component of a multi-disciplinary discharge planning process, led by the attending physician.  Recommendations may be updated based on patient status, additional functional criteria and insurance authorization.   Assistance Recommended at Discharge Intermittent Supervision/Assistance  Patient can return home with the following A little help with walking and/or transfers;A little help with bathing/dressing/bathroom;Assistance with cooking/housework;Assist for transportation;Help with stairs or ramp for entrance       Equipment Recommendations  BSC/3in1;Other (comment) (RW)       Precautions / Restrictions Precautions Precautions: Fall;Knee Precaution Booklet Issued: Yes (comment) Restrictions Weight Bearing Restrictions: Yes RLE Weight Bearing: Weight bearing as tolerated      Mobility Bed Mobility               General bed mobility comments: pt in recliner chair     Transfers Overall transfer level: Needs assistance   Transfers: Sit to/from Stand Sit to Stand: Supervision           General transfer comment: VC's for hand placement      Balance Overall balance assessment: Needs assistance                                         ADL either performed or assessed with clinical judgement   ADL Overall ADL's : Needs assistance/impaired     Grooming: Wash/dry hands;Wash/dry face;Oral care;Applying deodorant;Brushing hair;Supervision/safety;Standing           Upper Body Dressing : Set up;Supervision/safety;Sitting   Lower Body Dressing: Minimal assistance;Sit to/from stand Lower Body Dressing Details (indicate cue type and reason): assistance to thread clothing over B feet and discussed use of LH reacher at home for this Toilet Transfer: Supervision/safety;Rolling walker (2 wheels) Toilet Transfer Details (indicate cue type and reason): simulated         Functional mobility during ADLs: Supervision/safety       Vision Patient Visual Report: No change from baseline              Pertinent Vitals/Pain Pain Assessment Pain Assessment: Faces Faces Pain Scale: Hurts a little bit     Hand Dominance Right   Extremity/Trunk Assessment Upper Extremity Assessment Upper Extremity Assessment: Overall WFL for tasks assessed   Lower Extremity Assessment Lower Extremity Assessment: Defer to PT evaluation RLE Deficits / Details: AROM deficits as expected  Communication Communication Communication: No difficulties   Cognition Arousal/Alertness: Awake/alert Behavior During Therapy: WFL for tasks assessed/performed Overall Cognitive Status: Within Functional Limits for tasks assessed                                                  Home Living Family/patient expects to be discharged to:: Private residence Living Arrangements: Alone Available Help at Discharge: Family;Available 24  hours/day Type of Home: Apartment Home Access: Level entry (small threshold)     Home Layout: Two level;Able to live on main level with bedroom/bathroom Alternate Level Stairs-Number of Steps: flight   Bathroom Shower/Tub: Producer, television/film/video: Standard Bathroom Accessibility: Yes   Home Equipment: Educational psychologist (4 wheels);Cane - single point [employer]: Reacher        Prior Functioning/Environment Prior Level of Function : Independent/Modified Independent                                 OT Goals(Current goals can be found in the care plan section) Acute Rehab OT Goals Patient Stated Goal: to go home OT Goal Formulation: With patient Time For Goal Achievement: 05/12/22 Potential to Achieve Goals: Good  OT Frequency:         AM-PAC OT "6 Clicks" Daily Activity     Outcome Measure Help from another person eating meals?: None Help from another person taking care of personal grooming?: None Help from another person toileting, which includes using toliet, bedpan, or urinal?: None Help from another person bathing (including washing, rinsing, drying)?: None Help from another person to put on and taking off regular upper body clothing?: None Help from another person to put on and taking off regular lower body clothing?: A Little 6 Click Score: 23   End of Session Equipment Utilized During Treatment: Rolling walker (2 wheels) Nurse Communication: Mobility status  Activity Tolerance: Patient tolerated treatment well Patient left: in bed;with call bell/phone within reach;with bed alarm set                   Time: [phone] OT Time Calculation (min): 29 min Charges:  OT General Charges $OT Visit: 1 Visit OT Evaluation $OT Eval Low Complexity: 1 Low OT Treatments $Self Care/Home Management : 8-22 mins  Jackquline Denmark, MS, OTR/L , CBIS ascom [phone]  05/12/22, 10:42 AM

## 2022-05-12 NOTE — Plan of Care (Signed)
  Problem: Activity: Goal: Ability to avoid complications of mobility impairment will improve Outcome: Progressing   Problem: Pain Management: Goal: Pain level will decrease with appropriate interventions Outcome: Progressing   Problem: Skin Integrity: Goal: Will show signs of wound healing Outcome: Progressing   

## 2022-05-12 NOTE — TOC Progression Note (Signed)
Transition of Care [facility]) - Progression Note    Patient Details  Name: Sherry Paul MRN: [id number] Date of Birth: 12-04-1945  Transition of [facility] CM/SW Contact  Marlowe Sax, RN Phone Number: 05/12/2022, 8:38 AM  Clinical Narrative:     The patient will get a rolling walker and 3 in 1 delivered by [employer] [facility] is set up for Home health prior to Surgery by Surgeons office  Expected Discharge Plan: Home w Home Health Services Barriers to Discharge: No Barriers Identified  Expected Discharge Plan and Services   Discharge Planning Services: CM Consult Post Acute Care Choice: Home Health Living arrangements for the past 2 months: Single Family Home Expected Discharge Date: 05/12/22               DME Arranged: Dan Humphreys rolling, 3-N-1 DME Agency: [employer] Date DME Agency Contacted: 05/12/22 Time DME Agency Contacted: [phone] Representative spoke with at DME Agency: Montez Morita Arranged: PT HH Agency: [facility] Date [facility] Agency Contacted: 05/12/22 Time HH Agency Contacted: [phone] Representative spoke with at [facility] Agency: [place]   Social Determinants of Health (SDOH) Interventions SDOH Screenings   Food Insecurity: No Food Insecurity (05/11/2022)  Housing: Low Risk  (05/11/2022)  Transportation Needs: No Transportation Needs (05/11/2022)  Utilities: Not At Risk (05/11/2022)  Tobacco Use: Medium Risk (05/11/2022)    Readmission Risk Interventions     No data to display

## 2022-05-13 ENCOUNTER — Telehealth: Payer: Self-pay | Admitting: *Deleted

## 2022-05-13 NOTE — Telephone Encounter (Signed)
Pt called Post op to talk to nurse regarding "ted hose not fitting."  Pt was d/c home from surge periop on 05/12/22 with thigh high ted hose on bilateral legs.  Pt stated she removed them last night before bed, washed and dried them.  This morning she and her sister could not get the ted hose back on because "they were too little."  Pt was requesting a bigger size.  Informed pt the ted hose were not too little, and a bigger size would not compress properly.  Pt was frustrated.  Informed pt if she could come in, I could show her how to put the [facility] back on.  Pt declined offer.  Suggested pt could look up how to put on ted hose on youtube, pt declined.  Suggested allowing the physical therapist to show her how to put ted hose back on when therapy returns to her house, she stated she did not want to do that.  Pt then stated, "so you don't want to help me."  I explained I have been offering suggestions but she has declined all options that have been offered.

## 2022-12-09 ENCOUNTER — Other Ambulatory Visit: Payer: Self-pay | Admitting: Nurse Practitioner

## 2022-12-09 DIAGNOSIS — R748 Abnormal levels of other serum enzymes: Secondary | ICD-10-CM

## 2022-12-09 DIAGNOSIS — E669 Obesity, unspecified: Secondary | ICD-10-CM

## 2022-12-14 ENCOUNTER — Ambulatory Visit
Admission: RE | Admit: 2022-12-14 | Discharge: 2022-12-14 | Disposition: A | Discharge status: Home / Self Care | Payer: Medicare HMO | Source: Ambulatory Visit | Attending: Nurse Practitioner | Admitting: Nurse Practitioner

## 2022-12-14 DIAGNOSIS — E669 Obesity, unspecified: Secondary | ICD-10-CM | POA: Insufficient documentation

## 2022-12-14 DIAGNOSIS — R748 Abnormal levels of other serum enzymes: Secondary | ICD-10-CM | POA: Insufficient documentation

## 2023-01-07 ENCOUNTER — Other Ambulatory Visit: Payer: Self-pay | Admitting: Nurse Practitioner

## 2023-01-07 DIAGNOSIS — K7689 Other specified diseases of liver: Secondary | ICD-10-CM

## 2023-01-07 DIAGNOSIS — R748 Abnormal levels of other serum enzymes: Secondary | ICD-10-CM

## 2023-01-07 DIAGNOSIS — K760 Fatty (change of) liver, not elsewhere classified: Secondary | ICD-10-CM

## 2023-01-15 ENCOUNTER — Other Ambulatory Visit: Payer: Self-pay | Admitting: Nurse Practitioner

## 2023-01-15 ENCOUNTER — Ambulatory Visit
Admission: RE | Admit: 2023-01-15 | Discharge: 2023-01-15 | Disposition: A | Discharge status: Home / Self Care | Payer: Medicare HMO | Source: Ambulatory Visit | Attending: Nurse Practitioner | Admitting: Nurse Practitioner

## 2023-01-15 DIAGNOSIS — K760 Fatty (change of) liver, not elsewhere classified: Secondary | ICD-10-CM | POA: Insufficient documentation

## 2023-01-15 DIAGNOSIS — K7689 Other specified diseases of liver: Secondary | ICD-10-CM | POA: Diagnosis present

## 2023-01-15 DIAGNOSIS — R748 Abnormal levels of other serum enzymes: Secondary | ICD-10-CM | POA: Insufficient documentation

## 2023-01-15 MED ORDER — GADOBUTROL 1 MMOL/ML IV SOLN
9.0000 mL | Freq: Once | INTRAVENOUS | Status: AC | PRN
  Administered 2023-01-15: 9 mL via INTRAVENOUS

## 2023-02-24 NOTE — H&P (Signed)
 Pre-Procedure H&P   Patient ID: Sherry Paul is a 78 y.o. female.  Gastroenterology Provider: Elspeth Ozell Jungling, DO  Referring Provider: Luke Barefoot, NP PCP: Don Lauraine Collar, NP  Date: 02/25/2023  HPI Sherry Paul is a 78 y.o. female who presents today for Colonoscopy for Personal history of colon polyps .  Last underwent colonoscopy in September 2019 demonstrating internal hemorrhoids.  Diverticulosis in 2012.  1 adenomatous polyp in 2007 Hemoglobin 11.6 MCV 84.5 platelets 237,000  Patient reports daily bowel movement without melena or hematochezia.  She is status post C-section  Hemoglobin 11.6 MCV 84.5 platelets 237,000 saturation 14% TIBC 442 ferritin 14 creatinine 0.8  MRI MRCP recently performed demonstrating liver hemangioma and hepatic cyst.  Normal pancreaticobiliary system  No family history of colon cancer or colon polyps    Past Medical History:  Diagnosis Date   Arthritis    Asthma    Cancer (HCC)    thyroid ca   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HCC)    Diabetes mellitus without complication (HCC)    Hypothyroidism    Onychomycosis    Osteoporosis     Past Surgical History:  Procedure Laterality Date   CESAREAN SECTION     x 2   COLONOSCOPY WITH PROPOFOL  N/A 10/04/2017   Procedure: COLONOSCOPY WITH PROPOFOL ;  Surgeon: Viktoria Lamar DASEN, MD;  Location: [facility] ENDOSCOPY;  Service: Endoscopy;  Laterality: N/A;   ESOPHAGOGASTRODUODENOSCOPY N/A 06/25/2014   Procedure: ESOPHAGOGASTRODUODENOSCOPY (EGD);  Surgeon: Lamar DASEN Viktoria, MD;  Location: [facility] ENDOSCOPY;  Service: Endoscopy;  Laterality: N/A;   KNEE ARTHROPLASTY Right 05/11/2022   Procedure: COMPUTER ASSISTED TOTAL KNEE ARTHROPLASTY;  Surgeon: Mardee Lynwood SQUIBB, MD;  Location: [facility] ORS;  Service: Orthopedics;  Laterality: Right;   LAPAROSCOPIC BILATERAL SALPINGO OOPHERECTOMY Bilateral 04/12/2020   Procedure: LAPAROSCOPIC BILATERAL SALPINGO OOPHORECTOMY;  Surgeon: Schermerhorn,  Debby PARAS, MD;  Location: [facility] ORS;  Service: Gynecology;  Laterality: Bilateral;   monitoring cranial nerves bilateral     OOPHORECTOMY Bilateral    SAVORY DILATION N/A 06/25/2014   Procedure: SAVORY DILATION;  Surgeon: Lamar DASEN Viktoria, MD;  Location: [facility] ENDOSCOPY;  Service: Endoscopy;  Laterality: N/A;   TONSILLECTOMY     TOTAL THYROIDECTOMY      Family History No h/o GI disease or malignancy  Review of Systems  Constitutional:  Negative for activity change, appetite change, chills, diaphoresis, fatigue, fever and unexpected weight change.  HENT:  Negative for trouble swallowing and voice change.   Respiratory:  Negative for shortness of breath and wheezing.   Cardiovascular:  Negative for chest pain, palpitations and leg swelling.  Gastrointestinal:  Negative for abdominal distention, abdominal pain, anal bleeding, blood in stool, constipation, diarrhea, nausea, rectal pain and vomiting.  Musculoskeletal:  Negative for arthralgias and myalgias.  Skin:  Negative for color change and pallor.  Neurological:  Negative for dizziness, syncope and weakness.  Psychiatric/Behavioral:  Negative for confusion.   All other systems reviewed and are negative.    Medications No current facility-administered medications on file prior to encounter.   Current Outpatient Medications on File Prior to Encounter  Medication Sig Dispense Refill   ALPHA LIPOIC ACID PO Take 300 mg by mouth in the morning and at bedtime.     ascorbic acid (VITAMIN C) 500 MG tablet Take 500 mg by mouth 2 (two) times daily.     ASHWAGANDHA PO Take 750 mg by mouth in the morning and at bedtime.     Calcium Carb-Cholecalciferol (CALCIUM 600 +  D PO) Take 1 tablet by mouth 2 (two) times daily.     Cholecalciferol (VITAMIN D) 50 MCG (2000 UT) tablet Take 2,000 Units by mouth daily.     Coenzyme Q10 100 MG capsule Take 100 mg by mouth in the morning, at noon, in the evening, and at bedtime.     Fiber 500 MG CAPS Take 6  tablets by mouth daily.     Ginkgo Biloba (GINKOBA PO) Take 60 mg by mouth in the morning and at bedtime.     Glucosamine Sulfate 500 MG CAPS Take 680 mg by mouth in the morning, at noon, and at bedtime.     ibandronate (BONIVA) 150 MG tablet Take 150 mg by mouth every 30 (thirty) days. Take in the morning with a full glass of water, on an empty stomach, and do not take anything else by mouth or lie down for the next 30 min.     LEVOCARNITINE-B5-TAURINE PO Take 1,000 mg by mouth daily.     levothyroxine  (SYNTHROID , LEVOTHROID) 150 MCG tablet Take 75-150 mcg by mouth daily. 1 tab 6 days a week, 75 mcg on Saturday     loratadine  (CLARITIN ) 10 MG tablet Take 10 mg by mouth daily as needed for allergies.     lovastatin (MEVACOR) 40 MG tablet Take 40 mg by mouth at bedtime.     Menaquinone-7 (VITAMIN K2) 100 MCG CAPS Take 600 mcg by mouth daily.     Multiple Vitamin (MULTIVITAMIN WITH MINERALS) TABS tablet Take 1 tablet by mouth daily.     Multiple Vitamins-Minerals (PRESERVISION AREDS 2 PO) Take 1 tablet by mouth in the morning and at bedtime.     Omega-3 Fatty Acids (FISH OIL) 1000 MG CAPS Take 1,000 mg by mouth daily.     OVER THE COUNTER MEDICATION Take 500 mg by mouth in the morning and at bedtime. Bacopa     OVER THE COUNTER MEDICATION Take 1,000 mg by mouth daily. [place]     OVER THE COUNTER MEDICATION Take 100 mg by mouth in the morning and at bedtime. Neuro PS     OVER THE COUNTER MEDICATION Take 60 mg by mouth in the morning and at bedtime. Pycnogenol     oxyCODONE  (OXY IR/ROXICODONE ) 5 MG immediate release tablet Take 1 tablet (5 mg total) by mouth every 4 (four) hours as needed for moderate pain (pain score 4-6). 30 tablet 0   Probiotic Product (PROBIOTIC DAILY PO) Take 1 capsule by mouth in the morning and at bedtime.     psyllium (REGULOID) 0.52 G capsule Take 6 capsules by mouth daily.     St Johns Wort 300 MG CAPS Take 300 mg by mouth 2 (two) times daily.     traMADol  (ULTRAM ) 50 MG  tablet Take 1-2 tablets (50-100 mg total) by mouth every 4 (four) hours as needed for moderate pain. 30 tablet 0   Turmeric (QC TUMERIC COMPLEX PO) Take 800 mg by mouth in the morning and at bedtime.     vitamin E 180 MG (400 UNITS) capsule Take 400 Units by mouth daily.      Pertinent medications related to GI and procedure were reviewed by me with the patient prior to the procedure   Current Facility-Administered Medications:    0.9 %  sodium chloride  infusion, , Intravenous, Continuous, Onita Elspeth Sharper, DO, Last Rate: 20 mL/hr at 02/25/23 1046, New Bag at 02/25/23 1046  sodium chloride  20 mL/hr at 02/25/23 1046  Allergies  Allergen Reactions   Codeine Other (See Comments)    Bizarre idealization     Huperzine A Other (See Comments)    Confusion   Omeprazole Other (See Comments)    Confusion    Allergies were reviewed by me prior to the procedure  Objective   Body mass index is 38.17 kg/m. Vitals:   02/25/23 1024  BP: 128/65  Pulse: 72  Resp: 17  Temp: (!) 97.1 F (36.2 C)  TempSrc: Temporal  SpO2: 99%  Weight: 91.6 kg  Height: 5' 1 (1.549 m)     Physical Exam Vitals and nursing note reviewed.  Constitutional:      General: She is not in acute distress.    Appearance: Normal appearance. She is obese. She is not ill-appearing, toxic-appearing or diaphoretic.  HENT:     Head: Normocephalic and atraumatic.     Nose: Nose normal.     Mouth/Throat:     Mouth: Mucous membranes are moist.     Pharynx: Oropharynx is clear.  Eyes:     General: No scleral icterus.    Extraocular Movements: Extraocular movements intact.  Cardiovascular:     Rate and Rhythm: Normal rate and regular rhythm.     Heart sounds: Normal heart sounds. No murmur heard.    No friction rub. No gallop.  Pulmonary:     Effort: Pulmonary effort is normal. No respiratory distress.     Breath sounds: Normal breath sounds. No wheezing, rhonchi or rales.  Abdominal:     General:  Bowel sounds are normal. There is no distension.     Palpations: Abdomen is soft.     Tenderness: There is no abdominal tenderness. There is no guarding or rebound.  Musculoskeletal:     Cervical back: Neck supple.     Right lower leg: No edema.     Left lower leg: No edema.  Skin:    General: Skin is warm and dry.     Coloration: Skin is not jaundiced or pale.  Neurological:     General: No focal deficit present.     Mental Status: She is alert and oriented to person, place, and time. Mental status is at baseline.  Psychiatric:        Mood and Affect: Mood normal.        Behavior: Behavior normal.        Thought Content: Thought content normal.        Judgment: Judgment normal.      Assessment:  Ms. Ahlani Wickes is a 78 y.o. female  who presents today for Colonoscopy for Personal history of colon polyps .  Plan:  Colonoscopy with possible intervention today  Colonoscopy with possible biopsy, control of bleeding, polypectomy, and interventions as necessary has been discussed with the patient/patient representative. Informed consent was obtained from the patient/patient representative after explaining the indication, nature, and risks of the procedure including but not limited to death, bleeding, perforation, missed neoplasm/lesions, cardiorespiratory compromise, and reaction to medications. Opportunity for questions was given and appropriate answers were provided. Patient/patient representative has verbalized understanding is amenable to undergoing the procedure.   Elspeth Ozell Jungling, DO  [facility]  Portions of the record may have been created with voice recognition software. Occasional wrong-word or 'sound-a-like' substitutions may have occurred due to the inherent limitations of voice recognition software.  Read the chart carefully and recognize, using context, where substitutions may have occurred.

## 2023-02-25 ENCOUNTER — Encounter: Payer: Self-pay | Admitting: Gastroenterology

## 2023-02-25 ENCOUNTER — Encounter
Admission: RE | Disposition: A | Discharge status: Home / Self Care | Payer: Self-pay | Source: Home / Self Care | Attending: Gastroenterology

## 2023-02-25 ENCOUNTER — Ambulatory Visit
Admission: RE | Admit: 2023-02-25 | Discharge: 2023-02-25 | Disposition: A | Discharge status: Home / Self Care | Payer: Medicare HMO | Attending: Gastroenterology | Admitting: Gastroenterology

## 2023-02-25 ENCOUNTER — Ambulatory Visit: Payer: Medicare HMO | Admitting: Certified Registered"

## 2023-02-25 DIAGNOSIS — K621 Rectal polyp: Secondary | ICD-10-CM | POA: Diagnosis not present

## 2023-02-25 DIAGNOSIS — D122 Benign neoplasm of ascending colon: Secondary | ICD-10-CM | POA: Diagnosis not present

## 2023-02-25 DIAGNOSIS — E890 Postprocedural hypothyroidism: Secondary | ICD-10-CM | POA: Insufficient documentation

## 2023-02-25 DIAGNOSIS — K5730 Diverticulosis of large intestine without perforation or abscess without bleeding: Secondary | ICD-10-CM | POA: Diagnosis not present

## 2023-02-25 DIAGNOSIS — Z87891 Personal history of nicotine dependence: Secondary | ICD-10-CM | POA: Diagnosis not present

## 2023-02-25 DIAGNOSIS — Z6838 Body mass index (BMI) 38.0-38.9, adult: Secondary | ICD-10-CM | POA: Insufficient documentation

## 2023-02-25 DIAGNOSIS — Z79890 Hormone replacement therapy (postmenopausal): Secondary | ICD-10-CM | POA: Insufficient documentation

## 2023-02-25 DIAGNOSIS — E119 Type 2 diabetes mellitus without complications: Secondary | ICD-10-CM | POA: Diagnosis not present

## 2023-02-25 DIAGNOSIS — Z1211 Encounter for screening for malignant neoplasm of colon: Secondary | ICD-10-CM | POA: Insufficient documentation

## 2023-02-25 DIAGNOSIS — J45909 Unspecified asthma, uncomplicated: Secondary | ICD-10-CM | POA: Diagnosis not present

## 2023-02-25 DIAGNOSIS — M1990 Unspecified osteoarthritis, unspecified site: Secondary | ICD-10-CM | POA: Diagnosis not present

## 2023-02-25 DIAGNOSIS — G4730 Sleep apnea, unspecified: Secondary | ICD-10-CM | POA: Insufficient documentation

## 2023-02-25 DIAGNOSIS — Z79899 Other long term (current) drug therapy: Secondary | ICD-10-CM | POA: Insufficient documentation

## 2023-02-25 DIAGNOSIS — E669 Obesity, unspecified: Secondary | ICD-10-CM | POA: Insufficient documentation

## 2023-02-25 DIAGNOSIS — Z85850 Personal history of malignant neoplasm of thyroid: Secondary | ICD-10-CM | POA: Insufficient documentation

## 2023-02-25 DIAGNOSIS — K640 First degree hemorrhoids: Secondary | ICD-10-CM | POA: Diagnosis not present

## 2023-02-25 HISTORY — PX: POLYPECTOMY: SHX5525

## 2023-02-25 HISTORY — PX: COLONOSCOPY: SHX5424

## 2023-02-25 SURGERY — COLONOSCOPY
Anesthesia: General

## 2023-02-25 MED ORDER — PROPOFOL 500 MG/50ML IV EMUL
INTRAVENOUS | Status: DC | PRN
  Administered 2023-02-25: 135 ug/kg/min via INTRAVENOUS

## 2023-02-25 MED ORDER — PROPOFOL 10 MG/ML IV BOLUS
INTRAVENOUS | Status: DC | PRN
  Administered 2023-02-25 (×2): 20 mg via INTRAVENOUS
  Administered 2023-02-25: 50 mg via INTRAVENOUS

## 2023-02-25 MED ORDER — SODIUM CHLORIDE 0.9 % IV SOLN: INTRAVENOUS | Status: DC

## 2023-02-25 NOTE — Transfer of Care (Signed)
 Immediate Anesthesia Transfer of Care Note  Patient: Sherry Paul  Procedure(s) Performed: COLONOSCOPY POLYPECTOMY  Patient Location: PACU  Anesthesia Type:General  Level of Consciousness: drowsy  Airway & Oxygen Therapy: Patient Spontanous Breathing and Patient connected to face mask oxygen  Post-op Assessment: Report given to RN, Post -op Vital signs reviewed and stable, and Patient moving all extremities X 4  Post vital signs: Reviewed and stable  Last Vitals:  Vitals Value Taken Time  BP 108/65   Temp    Pulse 78 02/25/23 1139  Resp 14 02/25/23 1139  SpO2 98 % 02/25/23 1139  Vitals shown include unfiled device data.  Last Pain:  Vitals:   02/25/23 1024  TempSrc: Temporal  PainSc: 0-No pain         Complications: No notable events documented.

## 2023-02-25 NOTE — Anesthesia Postprocedure Evaluation (Signed)
 Anesthesia Post Note  Patient: Sherry Paul  Procedure(s) Performed: COLONOSCOPY POLYPECTOMY  Patient location during evaluation: PACU Anesthesia Type: General Level of consciousness: awake and alert, oriented and patient cooperative Pain management: pain level controlled Vital Signs Assessment: post-procedure vital signs reviewed and stable Respiratory status: spontaneous breathing, nonlabored ventilation and respiratory function stable Cardiovascular status: blood pressure returned to baseline and stable Postop Assessment: adequate PO intake Anesthetic complications: no   No notable events documented.   Last Vitals:  Vitals:   02/25/23 1153 02/25/23 1157  BP: 123/67 122/63  Pulse: 76 67  Resp: 15 14  Temp:    SpO2: 100% 100%    Last Pain:  Vitals:   02/25/23 1157  TempSrc:   PainSc: 0-No pain                 Alfonso Ruths

## 2023-02-25 NOTE — Op Note (Signed)
 [facility] Patient Name: Sherry Paul Procedure Date: 02/25/2023 11:00 AM MRN: [id number] Account #: [id number] Date of Birth: 1945/08/16 Admit Type: Outpatient Age: 77 Room: [facility] ENDO ROOM 1 Gender: Female Note Status: Finalized Instrument Name: Colonoscope [id number] Procedure:             Colonoscopy Indications:           High risk colon cancer surveillance: Personal history                         of colonic polyps Providers:             Elspeth Ozell Onita ROSALEA, DO Referring MD:          Lauraine LOIS Leak (Referring MD) Medicines:             Monitored Anesthesia Care Complications:         No immediate complications. Estimated blood loss:                         Minimal. Procedure:             Pre-Anesthesia Assessment:                        - Prior to the procedure, a History and Physical was                         performed, and patient medications and allergies were                         reviewed. The patient is competent. The risks and                         benefits of the procedure and the sedation options and                         risks were discussed with the patient. All questions                         were answered and informed consent was obtained.                         Patient identification and proposed procedure were                         verified by the physician, the nurse, the anesthetist                         and the technician in the endoscopy suite. Mental                         Status Examination: alert and oriented. Airway                         Examination: normal oropharyngeal airway and neck                         mobility. Respiratory Examination: clear to  auscultation. CV Examination: RRR, no murmurs, no S3                         or S4. Prophylactic Antibiotics: The patient does not                         require prophylactic antibiotics. Prior                          Anticoagulants: The patient has taken no anticoagulant                         or antiplatelet agents. ASA Grade Assessment: II - A                         patient with mild systemic disease. After reviewing                         the risks and benefits, the patient was deemed in                         satisfactory condition to undergo the procedure. The                         anesthesia plan was to use monitored anesthesia care                         (MAC). Immediately prior to administration of                         medications, the patient was re-assessed for adequacy                         to receive sedatives. The heart rate, respiratory                         rate, oxygen saturations, blood pressure, adequacy of                         pulmonary ventilation, and response to care were                         monitored throughout the procedure. The physical                         status of the patient was re-assessed after the                         procedure.                        After obtaining informed consent, the colonoscope was                         passed under direct vision. Throughout the procedure,                         the patient's blood pressure, pulse, and oxygen  saturations were monitored continuously. The                         Colonoscope was introduced through the anus and                         advanced to the the cecum, identified by appendiceal                         orifice and ileocecal valve. The colonoscopy was                         performed without difficulty. The patient tolerated                         the procedure well. The quality of the bowel                         preparation was evaluated using the BBPS [facility] Bowel                         Preparation Scale) with scores of: Right Colon = 2                         (minor amount of residual staining, small fragments of                         stool and/or  opaque liquid, but mucosa seen well),                         Transverse Colon = 3 (entire mucosa seen well with no                         residual staining, small fragments of stool or opaque                         liquid) and Left Colon = 3 (entire mucosa seen well                         with no residual staining, small fragments of stool or                         opaque liquid). The total BBPS score equals 8. The                         quality of the bowel preparation was excellent. The                         ileocecal valve, appendiceal orifice, and rectum were                         photographed. Findings:      The perianal and digital rectal examinations were normal. Pertinent       negatives include normal sphincter tone.      Three sessile polyps were found in the rectum (1) and ascending colon       (2). The polyps were 1 to  2 mm in size. These polyps were removed with a       jumbo cold forceps. Resection and retrieval were complete. Estimated       blood loss was minimal.      Multiple small-mouthed diverticula were found in the sigmoid colon.       Estimated blood loss: none.      Non-bleeding internal hemorrhoids were found during retroflexion. The       hemorrhoids were Grade I (internal hemorrhoids that do not prolapse).       Estimated blood loss: none.      The exam was otherwise without abnormality on direct and retroflexion       views. Impression:            - Three 1 to 2 mm polyps in the rectum and in the                         ascending colon, removed with a jumbo cold forceps.                         Resected and retrieved.                        - Diverticulosis in the sigmoid colon.                        - Non-bleeding internal hemorrhoids.                        - The examination was otherwise normal on direct and                         retroflexion views. Recommendation:        - Patient has a contact number available for                          emergencies. The signs and symptoms of potential                         delayed complications were discussed with the patient.                         Return to normal activities tomorrow. Written                         discharge instructions were provided to the patient.                        - Discharge patient to home.                        - Resume previous diet.                        - Continue present medications.                        - Await pathology results.                        - Repeat colonoscopy for surveillance based on  pathology results.                        - Return to referring physician as previously                         scheduled.                        - The findings and recommendations were discussed with                         the patient. Procedure Code(s):     --- Professional ---                        [phone], Colonoscopy, flexible; with biopsy, single or                         multiple Diagnosis Code(s):     --- Professional ---                        Z86.010, Personal history of colonic polyps                        D12.8, Benign neoplasm of rectum                        D12.2, Benign neoplasm of ascending colon                        K64.0, First degree hemorrhoids                        K57.30, Diverticulosis of large intestine without                         perforation or abscess without bleeding CPT copyright 2022 American Medical Association. All rights reserved. The codes documented in this report are preliminary and upon coder review may  be revised to meet current compliance requirements. Attending Participation:      I personally performed the entire procedure. Elspeth Jungling, DO Elspeth Ozell Jungling DO, DO 02/25/2023 11:38:42 AM This report has been signed electronically. Number of Addenda: 0 Note Initiated On: 02/25/2023 11:00 AM Scope Withdrawal Time: 0 hours 12 minutes 0 seconds  Total Procedure Duration: 0  hours 16 minutes 20 seconds  Estimated Blood Loss:  Estimated blood loss was minimal.      [facility]

## 2023-02-25 NOTE — Interval H&P Note (Signed)
 History and Physical Interval Note: Preprocedure H&P from 02/25/23  was reviewed and there was no interval change after seeing and examining the patient.  Written consent was obtained from the patient after discussion of risks, benefits, and alternatives. Patient has consented to proceed with Colonoscopy with possible intervention   02/25/2023 11:09 AM  Sherry Paul  has presented today for surgery, with the diagnosis of Hx of adenomatous colonic polyps (Z86.0101).  The various methods of treatment have been discussed with the patient and family. After consideration of risks, benefits and other options for treatment, the patient has consented to  Procedure(s): COLONOSCOPY (N/A) as a surgical intervention.  The patient's history has been reviewed, patient examined, no change in status, stable for surgery.  I have reviewed the patient's chart and labs.  Questions were answered to the patient's satisfaction.     Elspeth Ozell Jungling

## 2023-02-25 NOTE — Anesthesia Preprocedure Evaluation (Addendum)
 Anesthesia Evaluation  Patient identified by MRN, date of birth, ID band Patient awake    Reviewed: Allergy & Precautions, NPO status , Patient's Chart, lab work & pertinent test results  History of Anesthesia Complications Negative for: history of anesthetic complications  Airway Mallampati: IV   Neck ROM: Full    Dental  (+) Implants   Pulmonary asthma , sleep apnea , former smoker (quit 45 years ago)   Pulmonary exam normal breath sounds clear to auscultation       Cardiovascular Normal cardiovascular exam Rhythm:Regular Rate:Normal  ECG 05/01/22: normal   Neuro/Psych Left cerebellopontine angle meningioma s/p resection; Cavernous hemangioma, stable per 04/2022 MRI    GI/Hepatic negative GI ROS,,,  Endo/Other  Hypothyroidism (thyroid CA s/p thyroidectomy)  Obesity; prediabetes  Renal/GU negative Renal ROS     Musculoskeletal  (+) Arthritis ,    Abdominal   Peds  Hematology negative hematology ROS (+)   Anesthesia Other Findings   Reproductive/Obstetrics                             Anesthesia Physical Anesthesia Plan  ASA: 2  Anesthesia Plan: General   Post-op Pain Management:    Induction: Intravenous  PONV Risk Score and Plan: 3 and Propofol  infusion, TIVA and Treatment may vary due to age or medical condition  Airway Management Planned: Natural Airway  Additional Equipment:   Intra-op Plan:   Post-operative Plan:   Informed Consent: I have reviewed the patients History and Physical, chart, labs and discussed the procedure including the risks, benefits and alternatives for the proposed anesthesia with the patient or authorized representative who has indicated his/her understanding and acceptance.       Plan Discussed with: CRNA  Anesthesia Plan Comments: (LMA/GETA backup discussed.  Patient consented for risks of anesthesia including but not limited to:  - adverse  reactions to medications - damage to eyes, teeth, lips or other oral mucosa - nerve damage due to positioning  - sore throat or hoarseness - damage to heart, brain, nerves, lungs, other parts of body or loss of life  Informed patient about role of CRNA in peri- and intra-operative care.  Patient voiced understanding.)        Anesthesia Quick Evaluation

## 2023-02-25 NOTE — Anesthesia Procedure Notes (Signed)
 Procedure Name: MAC Date/Time: 02/25/2023 11:14 AM  Performed by: Ulanda Gambles, CRNAPre-anesthesia Checklist: Patient identified, Emergency Drugs available, Patient being monitored and Suction available Oxygen Delivery Method: Nasal cannula

## 2023-02-26 ENCOUNTER — Encounter: Payer: Self-pay | Admitting: Gastroenterology

## 2023-02-26 LAB — SURGICAL PATHOLOGY

## 2023-06-02 ENCOUNTER — Other Ambulatory Visit: Payer: Self-pay | Admitting: Nurse Practitioner

## 2023-06-02 DIAGNOSIS — Z1231 Encounter for screening mammogram for malignant neoplasm of breast: Secondary | ICD-10-CM

## 2023-06-17 ENCOUNTER — Ambulatory Visit
Admission: RE | Admit: 2023-06-17 | Discharge: 2023-06-17 | Disposition: A | Discharge status: Home / Self Care | Source: Ambulatory Visit | Attending: Nurse Practitioner | Admitting: Nurse Practitioner

## 2023-06-17 DIAGNOSIS — Z1231 Encounter for screening mammogram for malignant neoplasm of breast: Secondary | ICD-10-CM | POA: Diagnosis present

## 2023-08-16 ENCOUNTER — Other Ambulatory Visit: Payer: Self-pay | Admitting: Medical Genetics

## 2023-08-23 ENCOUNTER — Other Ambulatory Visit
Admission: RE | Admit: 2023-08-23 | Discharge: 2023-08-23 | Disposition: A | Discharge status: Home / Self Care | Payer: Self-pay | Source: Ambulatory Visit | Attending: Medical Genetics | Admitting: Medical Genetics

## 2023-09-05 LAB — GENECONNECT MOLECULAR SCREEN: Genetic Analysis Overall Interpretation: NEGATIVE

## 2023-10-11 ENCOUNTER — Other Ambulatory Visit: Payer: Self-pay | Admitting: Medical

## 2023-10-11 DIAGNOSIS — D320 Benign neoplasm of cerebral meninges: Secondary | ICD-10-CM

## 2023-10-11 DIAGNOSIS — Q283 Other malformations of cerebral vessels: Secondary | ICD-10-CM

## 2023-10-28 ENCOUNTER — Ambulatory Visit
Admission: RE | Admit: 2023-10-28 | Discharge: 2023-10-28 | Disposition: A | Discharge status: Home / Self Care | Source: Ambulatory Visit | Attending: Medical | Admitting: Medical

## 2023-10-28 DIAGNOSIS — D320 Benign neoplasm of cerebral meninges: Secondary | ICD-10-CM | POA: Diagnosis present

## 2023-10-28 DIAGNOSIS — Q283 Other malformations of cerebral vessels: Secondary | ICD-10-CM | POA: Insufficient documentation

## 2023-10-28 MED ORDER — GADOBUTROL 1 MMOL/ML IV SOLN
9.0000 mL | Freq: Once | INTRAVENOUS | Status: AC | PRN
  Administered 2023-10-28: 9 mL via INTRAVENOUS
